# Patient Record
Sex: Male | Born: 1965
Health system: Southern US, Community
[De-identification: ages and names within clinical notes are randomized; demographics above are authoritative.]

## PROBLEM LIST (undated history)

## (undated) DIAGNOSIS — E119 Type 2 diabetes mellitus without complications: Secondary | ICD-10-CM

## (undated) HISTORY — PX: APPENDECTOMY: SHX54

---

## 2000-03-03 ENCOUNTER — Encounter: Admission: RE | Admit: 2000-03-03 | Discharge: 2000-03-03 | Payer: Self-pay | Admitting: *Deleted

## 2002-10-25 ENCOUNTER — Encounter: Payer: Self-pay | Admitting: Internal Medicine

## 2002-10-25 ENCOUNTER — Ambulatory Visit (HOSPITAL_COMMUNITY): Admission: RE | Admit: 2002-10-25 | Discharge: 2002-10-25 | Payer: Self-pay | Admitting: Internal Medicine

## 2009-05-18 DIAGNOSIS — E1121 Type 2 diabetes mellitus with diabetic nephropathy: Secondary | ICD-10-CM | POA: Insufficient documentation

## 2016-04-27 ENCOUNTER — Ambulatory Visit: Payer: 59 | Admitting: Podiatry

## 2016-10-15 DIAGNOSIS — E1129 Type 2 diabetes mellitus with other diabetic kidney complication: Secondary | ICD-10-CM | POA: Diagnosis not present

## 2016-10-15 DIAGNOSIS — R808 Other proteinuria: Secondary | ICD-10-CM | POA: Diagnosis not present

## 2016-10-15 DIAGNOSIS — R509 Fever, unspecified: Secondary | ICD-10-CM | POA: Diagnosis not present

## 2016-12-31 ENCOUNTER — Ambulatory Visit (INDEPENDENT_AMBULATORY_CARE_PROVIDER_SITE_OTHER): Payer: 59 | Admitting: Podiatry

## 2016-12-31 ENCOUNTER — Ambulatory Visit (INDEPENDENT_AMBULATORY_CARE_PROVIDER_SITE_OTHER): Payer: 59

## 2016-12-31 ENCOUNTER — Encounter: Payer: Self-pay | Admitting: Podiatry

## 2016-12-31 DIAGNOSIS — M779 Enthesopathy, unspecified: Secondary | ICD-10-CM

## 2016-12-31 DIAGNOSIS — M79672 Pain in left foot: Secondary | ICD-10-CM | POA: Diagnosis not present

## 2016-12-31 DIAGNOSIS — M2140 Flat foot [pes planus] (acquired), unspecified foot: Secondary | ICD-10-CM

## 2016-12-31 DIAGNOSIS — M76829 Posterior tibial tendinitis, unspecified leg: Secondary | ICD-10-CM

## 2016-12-31 DIAGNOSIS — M79671 Pain in right foot: Secondary | ICD-10-CM

## 2016-12-31 MED ORDER — MELOXICAM 15 MG PO TABS: 15.0000 mg | ORAL_TABLET | Freq: Every day | ORAL | 2 refills | Status: AC

## 2016-12-31 NOTE — Patient Instructions (Signed)
Posterior Tibialis Tendinosis Rehab Ask your health care provider which exercises are safe for you. Do exercises exactly as told by your health care provider and adjust them as directed. It is normal to feel mild stretching, pulling, tightness, or discomfort as you do these exercises, but you should stop right away if you feel sudden pain or your pain gets worse.Do not begin these exercises until told by your health care provider. Stretching and range of motion exercises These exercises warm up your muscles and joints and improve the movement and flexibility in your ankle and foot. These exercises may also help to relieve pain. Exercise A: Standing wall calf stretch, knee straight   1. Stand with your hands against a wall. 2. Extend your __________ leg behind you, and bend your front knee slightly. Keep both of your heels on the floor. 3. Point the toes of your back foot slightly inward. 4. Keeping your heels on the floor and your back knee straight, shift your weight toward the wall. Do not allow your back to arch. You should feel a gentle stretch in the back of your lower leg (calf). 5. Hold this position for __________ seconds. Repeat __________ times. Complete this stretch __________ times a day. Exercise B: Standing wall calf stretch, knee bent  1. Stand with your hands against a wall. 2. Extend your __________ leg behind you, and bend your front knee slightly. Keep both of your heels on the floor. 3. Point the toes of your back foot slightly inward. 4. Unlock your back knee so it is bent. Keep your heels on the floor. You should feel a gentle stretch deep in your calf. 5. Hold this position for __________ seconds. Repeat __________ times. Complete this exercise __________ times a day. Strengthening exercises These exercises build strength and endurance in your ankle and foot. Endurance is the ability to use your muscles for a long time, even after they get tired. Exercise C: Ankle  inversion with band  1. Secure one end of an exercise band or tubing to a fixed object, such as a table leg or a pole, that will stay still when the band is pulled. to an object that will not move if it is pulled on, like a table leg. 2. Loop the other end of the band around the middle of your left / right foot. 3. Sit on the floor facing the object with your __________ leg extended. The band or tube should be slightly tense when your foot is relaxed. 4. Leading with your big toe, slowly bring your __________ foot and ankle inward, toward your other foot. 5. Hold this position for __________ seconds. 6. Slowly return your foot to the starting position. Repeat __________ times. Complete this exercise __________ times a day. Exercise D: Towel curls   1. Sit in a chair on a non-carpeted surface, and put your feet on the floor. 2. Place a towel in front of your feet. If told by your health care provider, add __________ at the end of the towel. 3. Keeping your heel on the floor, put your __________ foot on the towel. 4. Pull the towel toward you by grabbing the towel with your toes and curling them under. Keep your heel on the floor. 5. Let your toes relax. 6. Grab the towel with your toes again. Keep going until the towel is completely underneath your foot. Repeat __________ times. Complete this exercise __________ times a day. Balance exercises These exercises improve or maintain your balance. Balance is  important in preventing falls. Exercise E: Single leg stand  1. Without shoes, stand near a railing or in a doorway. You can hold on to the railing or door frame as needed for balance. 2. Stand on your __________ foot. Keep your big toe down on the floor and try to keep your arch lifted. If balancing in this position is too easy, try the exercise with your eyes closed or while standing on a pillow. 3. Hold this position for __________ seconds. Repeat __________ times. Complete this exercise  __________ times a day. This information is not intended to replace advice given to you by your health care provider. Make sure you discuss any questions you have with your health care provider. Document Released: 08/24/2005 Document Revised: 04/28/2016 Document Reviewed: 05/10/2015 Elsevier Interactive Patient Education  2017 [employer].

## 2016-12-31 NOTE — Progress Notes (Signed)
Subjective:    Patient ID: Phillip Hatfield, male   DOB: 51 y.o.   MRN: [id number]   HPI 51 year old male presents the office today for concerns left foot pain which has been ongoing for about 1 month. He states he has pain when he thinks in the arch going to the ankle point the medial aspect of the foot. The pain started he did start wearing his custom inserts as well as taking ibuprofen which is been helping. The pain still persist to some degree. He denies any recent injury or trauma. Denies any change in activity level. No numbness or tingling. He is diabetic and his last A1c was 7.1.  Review of Systems  All other systems reviewed and are negative.       Objective:  Physical Exam General: AAO x3, NAD  Dermatological: Skin is warm, dry and supple bilateral. Nails x 10 are well manicured; remaining integument appears unremarkable at this time. There are no open sores, no preulcerative lesions, no rash or signs of infection present.  Vascular: Dorsalis Pedis artery and Posterior Tibial artery pedal pulses are 2/4 bilateral with immedate capillary fill time. Pedal hair growth present. No varicosities and no lower extremity edema present bilateral. There is no pain with calf compression, swelling, warmth, erythema.   Neruologic: Grossly intact via light touch bilateral. Vibratory intact via tuning fork bilateral. Protective threshold with Semmes Wienstein monofilament intact to all pedal sites bilateral. Patellar and Achilles deep tendon reflexes 2+ bilateral. No Babinski or clonus noted bilateral.   Musculoskeletal: There is tenderness mildly distal to the medial malleolus on the course of the posterior tibial tendon on the insertion into the navicular tuberosity. Tenderness appears to be intact. He is able to do a double and single heel rise although single heel rises painful for him. There is no area pinpoint bony tenderness or pain the vibratory sensation. Decrease in medial arch upon  weightbearing. Muscular strength 5/5 in all groups tested bilateral.  Gait: Unassisted, Nonantalgic.      Assessment:     51 year old male left posterior tibial tendinitis, dysfunction    Plan:      -Treatment options discussed including all alternatives, risks, and complications -Etiology of symptoms were discussed -X-rays were obtained and reviewed with the patient. Talonavicular joint arthritis. Evidence of flatfoot. No evidence for acute fracture. -Prescribed mobic. Discussed side effects of the medication and directed to stop if any are to occur and call the office.  -Orthotics are fitting well. As his pain is improving will hold off on ankle brace -Rehab exercises for PT tendon -RTC in 4 weeks if symptoms continue  Celesta Gentile, DPM

## 2017-04-03 ENCOUNTER — Other Ambulatory Visit: Payer: Self-pay | Admitting: Podiatry

## 2017-04-15 DIAGNOSIS — Z0000 Encounter for general adult medical examination without abnormal findings: Secondary | ICD-10-CM | POA: Diagnosis not present

## 2017-04-22 DIAGNOSIS — E1129 Type 2 diabetes mellitus with other diabetic kidney complication: Secondary | ICD-10-CM | POA: Diagnosis not present

## 2017-04-22 DIAGNOSIS — Z0000 Encounter for general adult medical examination without abnormal findings: Secondary | ICD-10-CM | POA: Diagnosis not present

## 2017-04-22 DIAGNOSIS — Z794 Long term (current) use of insulin: Secondary | ICD-10-CM | POA: Diagnosis not present

## 2017-04-22 DIAGNOSIS — Z1389 Encounter for screening for other disorder: Secondary | ICD-10-CM | POA: Diagnosis not present

## 2017-04-22 DIAGNOSIS — E1165 Type 2 diabetes mellitus with hyperglycemia: Secondary | ICD-10-CM | POA: Diagnosis not present

## 2017-04-23 DIAGNOSIS — Z1212 Encounter for screening for malignant neoplasm of rectum: Secondary | ICD-10-CM | POA: Diagnosis not present

## 2017-05-26 DIAGNOSIS — E1165 Type 2 diabetes mellitus with hyperglycemia: Secondary | ICD-10-CM | POA: Diagnosis not present

## 2017-05-26 DIAGNOSIS — Z794 Long term (current) use of insulin: Secondary | ICD-10-CM | POA: Diagnosis not present

## 2017-05-26 DIAGNOSIS — I10 Essential (primary) hypertension: Secondary | ICD-10-CM | POA: Diagnosis not present

## 2017-06-21 DIAGNOSIS — Z23 Encounter for immunization: Secondary | ICD-10-CM | POA: Diagnosis not present

## 2017-07-22 DIAGNOSIS — E1129 Type 2 diabetes mellitus with other diabetic kidney complication: Secondary | ICD-10-CM | POA: Diagnosis not present

## 2017-07-22 DIAGNOSIS — Z794 Long term (current) use of insulin: Secondary | ICD-10-CM | POA: Diagnosis not present

## 2017-07-22 DIAGNOSIS — E1165 Type 2 diabetes mellitus with hyperglycemia: Secondary | ICD-10-CM | POA: Diagnosis not present

## 2017-09-27 ENCOUNTER — Encounter: Payer: Self-pay | Admitting: Podiatry

## 2017-09-27 ENCOUNTER — Ambulatory Visit: Payer: 59 | Admitting: Podiatry

## 2017-09-27 DIAGNOSIS — S90222A Contusion of left lesser toe(s) with damage to nail, initial encounter: Secondary | ICD-10-CM | POA: Diagnosis not present

## 2017-09-27 DIAGNOSIS — L601 Onycholysis: Secondary | ICD-10-CM | POA: Diagnosis not present

## 2017-09-27 DIAGNOSIS — D225 Melanocytic nevi of trunk: Secondary | ICD-10-CM | POA: Diagnosis not present

## 2017-09-27 DIAGNOSIS — X32XXXD Exposure to sunlight, subsequent encounter: Secondary | ICD-10-CM | POA: Diagnosis not present

## 2017-09-27 DIAGNOSIS — Z1283 Encounter for screening for malignant neoplasm of skin: Secondary | ICD-10-CM | POA: Diagnosis not present

## 2017-09-27 DIAGNOSIS — L570 Actinic keratosis: Secondary | ICD-10-CM | POA: Diagnosis not present

## 2017-09-27 MED ORDER — AMOXICILLIN-POT CLAVULANATE 875-125 MG PO TABS: 1.0000 | ORAL_TABLET | Freq: Two times a day (BID) | ORAL | 0 refills | Status: DC

## 2017-09-27 NOTE — Patient Instructions (Signed)

## 2017-09-29 NOTE — Progress Notes (Signed)
Subjective: Phillip Hatfield presents the office today for concerns of a balloon toenail which is been ongoing for the last several days that he has noticed but he is not sure when it started.  Denies any significant pain to the area denies any redness or drainage or any swelling.  He has noticed the nail somewhat loose.  He saw his dermatologist today for another issue and recommended to drill a hole in the area to drain it.  He presents today for evaluation of this.  Unsure of an injury.  Denies any systemic complaints such as fevers, chills, nausea, vomiting. No acute changes since last appointment, and no other complaints at this time.   Objective: AAO x3, NAD DP/PT pulses palpable bilaterally, CRT less than 3 seconds Left hallux toenail is loose from the underlying nail bed.  I was able to debride some of the loose toenail distally and upon debridement clear fluid was expressed from underneath the toenail the nail was lifted from the nail bed and you are able to visualize a granular wound base.  Because of this I recommended total nail avulsion, please see procedure note below.  No significant surrounding erythema there is no ascending cellulitis.  There is no clinical signs of infection noted. No open lesions or pre-ulcerative lesions.  No pain with calf compression, swelling, warmth, erythema  Assessment: Onycholysis, subungual hematoma  Plan: -All treatment options discussed with the patient including all alternatives, risks, complications.  -I started to debride the nail and realized that the nail was loose and there was a wound underneath the toenail present.  At this time, recommended total nail removal without chemical matricectomy to the left hallux toenail. Risks and complications were discussed with the patient for which they understand and written consent was obtained for the procedure. Under sterile conditions a total of 3 mL of a mixture of 2% lidocaine plain and 0.5% Marcaine plain was  infiltrated in a hallux block fashion. Once anesthetized, the skin was prepped in sterile fashion. A tourniquet was then applied. Next the left hallux nail was excised making sure to remove the entire offending nail border.  Clear drainage is expressed.  The underlying nail bed appeared to be intact there was old dried blood present which was debrided.  There was a granular wound base present.  No pus.  Once the nail was removed, the area was debrided and the underlying skin was intact. The area was irrigated and hemostasis was obtained.  A dry sterile dressing was applied. After application of the dressing the tourniquet was removed and there is found to be an immediate capillary refill time to the digit. The patient tolerated the procedure well any complications. Post procedure instructions were discussed the patient for which he verbally understood. Follow-up in one week for nail check or sooner if any problems are to arise. Discussed signs/symptoms of worsening infection and directed to call the office immediately should any occur or go directly to the emergency room. In the meantime, encouraged to call the office with any questions, concerns, changes symptoms. -Augmentin -Patient encouraged to call the office with any questions, concerns, change in symptoms.   Trula Slade DPM

## 2017-10-05 ENCOUNTER — Ambulatory Visit (INDEPENDENT_AMBULATORY_CARE_PROVIDER_SITE_OTHER): Payer: 59 | Admitting: Podiatry

## 2017-10-05 DIAGNOSIS — S90222A Contusion of left lesser toe(s) with damage to nail, initial encounter: Secondary | ICD-10-CM

## 2017-10-05 DIAGNOSIS — L601 Onycholysis: Secondary | ICD-10-CM

## 2017-10-05 NOTE — Patient Instructions (Signed)

## 2017-10-05 NOTE — Progress Notes (Signed)
Subjective: Phillip Hatfield is a 52 y.o.  male returns to office today for follow up evaluation after having left hallux total nail avulsion performed due to onycholysis/wound. Patient has been soaking using epsom salts and applying topical antibiotic covered with bandaid daily. Patient denies fevers, chills, nausea, vomiting. Denies any calf pain, chest pain, SOB.   Objective:  Vitals: Reviewed  General: Well developed, nourished, in no acute distress, alert and oriented x3   Dermatology: Skin is warm, dry and supple bilateral. Left hallux nail bed appears to be clean, dry, with mild granular tissue and surrounding scab and is almost healed. There is no surrounding erythema, edema, drainage/purulence. The remaining nails appear unremarkable at this time. There are no other lesions or other signs of infection present.  Neurovascular status: Intact. No lower extremity swelling; No pain with calf compression bilateral.  Musculoskeletal: Decreased tenderness to palpation of the left hallux nail bed. Muscular strength within normal limits bilateral.   Assesement and Plan: S/p partial nail avulsion, doing well.   -Continue soaking in epsom salts twice a day followed by antibiotic ointment and a band-aid. Can leave uncovered at night. Continue this until completely healed.  -If the area has not healed in 2 weeks, call the office for follow-up appointment, or sooner if any problems arise.  -Monitor for any signs/symptoms of infection. Call the office immediately if any occur or go directly to the emergency room. Call with any questions/concerns.  Celesta Gentile, DPM

## 2017-10-18 DIAGNOSIS — E1129 Type 2 diabetes mellitus with other diabetic kidney complication: Secondary | ICD-10-CM | POA: Diagnosis not present

## 2017-10-18 DIAGNOSIS — Z794 Long term (current) use of insulin: Secondary | ICD-10-CM | POA: Diagnosis not present

## 2017-10-18 DIAGNOSIS — E1165 Type 2 diabetes mellitus with hyperglycemia: Secondary | ICD-10-CM | POA: Diagnosis not present

## 2017-12-06 ENCOUNTER — Ambulatory Visit: Payer: 59 | Admitting: Podiatry

## 2017-12-06 ENCOUNTER — Encounter: Payer: Self-pay | Admitting: Podiatry

## 2017-12-06 DIAGNOSIS — L601 Onycholysis: Secondary | ICD-10-CM | POA: Diagnosis not present

## 2017-12-06 DIAGNOSIS — S90222A Contusion of left lesser toe(s) with damage to nail, initial encounter: Secondary | ICD-10-CM

## 2017-12-06 NOTE — Progress Notes (Signed)
Subjective: Phillip Hatfield presents the office today for concerns of his left second toenail becoming loose and painful he is getting pain towards the base of the nail.  He states his wife has noticed there is been wrong with the toenail and had him come to the office.  He denies any drainage or pus coming from the area denies any redness or swelling that he has noticed.  He said no recent treatment for this.  No recent injury that he can recall.  He thinks it may have started after his toenail got too long.  Denies any systemic complaints such as fevers, chills, nausea, vomiting. No acute changes since last appointment, and no other complaints at this time.   Objective: AAO x3, NAD DP/PT pulses palpable bilaterally, CRT less than 3 seconds The left second digit toenail was loose from the underlying nail bed and only attached on the proximal aspect and there is incurvation of both the medial lateral aspects of the nail corner there appears to be some mild localized edema to the nail corners but there is no drainage or pus or any ascending cellulitis.  There does appear to be old subungual hematoma to the entire toenail.  There is tenderness to the proximal nail border.   No open lesions or pre-ulcerative lesions.  No pain with calf compression, swelling, warmth, erythema  Assessment: Left second digit onycholysis  Plan: -All treatment options discussed with the patient including all alternatives, risks, complications.  -We discussed both conservative as well as surgical options.  This point I recommended a total nail avulsion left second digit toenail temporarily due to the pain as well as the lifting of the nail.  He was to go ahead and proceed with this today.  Risks and complications were discussed with the patient for which they understand and written consent was obtained. Under sterile conditions a total of 3 mL of a mixture of 2% lidocaine plain and 0.5% Marcaine plain was infiltrated in a digital  block fashion. Once anesthetized, the skin was prepped in sterile fashion. A tourniquet was then applied. Next the the left second digit nail was excised making sure to remove the entire offending nail border. Once the nail was  Removed, the area was debrided and the underlying skin was intact. The area was irrigated and hemostasis was obtained.  A dry sterile dressing was applied. After application of the dressing the tourniquet was removed and there is found to be an immediate capillary refill time to the digit. The patient tolerated the procedure well any complications. Post procedure instructions were discussed the patient for which he verbally understood. Follow-up in one week for nail check or sooner if any problems are to arise. Discussed signs/symptoms of worsening infection and directed to call the office immediately should any occur or go directly to the emergency room. In the meantime, encouraged to call the office with any questions, concerns, changes symptoms.  Trula Slade DPM    -Patient encouraged to call the office with any questions, concerns, change in symptoms.

## 2017-12-06 NOTE — Patient Instructions (Signed)

## 2017-12-13 ENCOUNTER — Ambulatory Visit: Payer: 59 | Admitting: Podiatry

## 2017-12-13 DIAGNOSIS — Z9889 Other specified postprocedural states: Secondary | ICD-10-CM

## 2017-12-13 DIAGNOSIS — L601 Onycholysis: Secondary | ICD-10-CM

## 2017-12-13 NOTE — Patient Instructions (Signed)

## 2017-12-13 NOTE — Progress Notes (Signed)
Subjective: Phillip Hatfield is a 52 y.o.  male returns to office today for follow up evaluation after having left 2nd digit total nail avulsion performed. Patient has been soaking using epsom salts and applying topical antibiotic covered with bandaid daily but he did not today. He only noticed some bloody drainage for the first few days, no pus. No redness. Patient denies fevers, chills, nausea, vomiting. Denies any calf pain, chest pain, SOB.   Objective:  Vitals: Reviewed  General: Well developed, nourished, in no acute distress, alert and oriented x3   Dermatology: Skin is warm, dry and supple bilateral.Left 2nd digit nail border appears to be clean, dry and is healed.  There is no surrounding erythema, edema, drainage/purulence. The remaining nails appear unremarkable at this time. There are no other lesions or other signs of infection present.  Neurovascular status: Intact. No lower extremity swelling; No pain with calf compression bilateral.  Musculoskeletal: No tenderness to palpation of the left 2nd digit nail bed. Muscular strength within normal limits bilateral.   Assesement and Plan: S/p partial nail avulsion, doing well.   -Continue soaking in epsom salts twice a day followed by antibiotic ointment and a band-aid. Can leave uncovered at night. Continue this until completely healed.  -If the area has not healed in 2 weeks, call the office for follow-up appointment, or sooner if any problems arise.  -Monitor for any signs/symptoms of infection. Call the office immediately if any occur or go directly to the emergency room. Call with any questions/concerns.  Celesta Gentile, DPM

## 2018-01-17 DIAGNOSIS — Z794 Long term (current) use of insulin: Secondary | ICD-10-CM | POA: Diagnosis not present

## 2018-01-17 DIAGNOSIS — E1165 Type 2 diabetes mellitus with hyperglycemia: Secondary | ICD-10-CM | POA: Diagnosis not present

## 2018-01-17 DIAGNOSIS — E1129 Type 2 diabetes mellitus with other diabetic kidney complication: Secondary | ICD-10-CM | POA: Diagnosis not present

## 2018-04-18 DIAGNOSIS — Z0000 Encounter for general adult medical examination without abnormal findings: Secondary | ICD-10-CM | POA: Diagnosis not present

## 2018-04-18 DIAGNOSIS — R82998 Other abnormal findings in urine: Secondary | ICD-10-CM | POA: Diagnosis not present

## 2018-04-25 DIAGNOSIS — E1165 Type 2 diabetes mellitus with hyperglycemia: Secondary | ICD-10-CM | POA: Diagnosis not present

## 2018-04-25 DIAGNOSIS — Z794 Long term (current) use of insulin: Secondary | ICD-10-CM | POA: Diagnosis not present

## 2018-04-25 DIAGNOSIS — Z1389 Encounter for screening for other disorder: Secondary | ICD-10-CM | POA: Diagnosis not present

## 2018-04-25 DIAGNOSIS — Z0000 Encounter for general adult medical examination without abnormal findings: Secondary | ICD-10-CM | POA: Diagnosis not present

## 2018-04-25 DIAGNOSIS — Z125 Encounter for screening for malignant neoplasm of prostate: Secondary | ICD-10-CM | POA: Diagnosis not present

## 2018-04-25 DIAGNOSIS — E1129 Type 2 diabetes mellitus with other diabetic kidney complication: Secondary | ICD-10-CM | POA: Diagnosis not present

## 2018-04-29 DIAGNOSIS — Z1212 Encounter for screening for malignant neoplasm of rectum: Secondary | ICD-10-CM | POA: Diagnosis not present

## 2018-06-06 DIAGNOSIS — R195 Other fecal abnormalities: Secondary | ICD-10-CM | POA: Diagnosis not present

## 2018-08-08 DIAGNOSIS — Z794 Long term (current) use of insulin: Secondary | ICD-10-CM | POA: Diagnosis not present

## 2018-08-08 DIAGNOSIS — E1165 Type 2 diabetes mellitus with hyperglycemia: Secondary | ICD-10-CM | POA: Diagnosis not present

## 2018-08-08 DIAGNOSIS — E1129 Type 2 diabetes mellitus with other diabetic kidney complication: Secondary | ICD-10-CM | POA: Diagnosis not present

## 2018-09-12 DIAGNOSIS — R195 Other fecal abnormalities: Secondary | ICD-10-CM | POA: Diagnosis not present

## 2018-11-07 DIAGNOSIS — E1129 Type 2 diabetes mellitus with other diabetic kidney complication: Secondary | ICD-10-CM | POA: Diagnosis not present

## 2018-11-07 DIAGNOSIS — Z794 Long term (current) use of insulin: Secondary | ICD-10-CM | POA: Diagnosis not present

## 2018-11-07 DIAGNOSIS — E1165 Type 2 diabetes mellitus with hyperglycemia: Secondary | ICD-10-CM | POA: Diagnosis not present

## 2019-11-10 ENCOUNTER — Ambulatory Visit: Payer: Self-pay | Attending: Internal Medicine

## 2019-11-10 DIAGNOSIS — Z23 Encounter for immunization: Secondary | ICD-10-CM | POA: Insufficient documentation

## 2019-11-10 NOTE — Progress Notes (Signed)
   Covid-19 Vaccination Clinic  Name:  Phillip Hatfield    MRN: [id number] DOB: 10-25-1965  11/10/2019  Phillip Hatfield was observed post Covid-19 immunization for 15 minutes without incident. He was provided with Vaccine Information Sheet and instruction to access the V-Safe system.   Phillip Hatfield was instructed to call 911 with any severe reactions post vaccine: Marland Kitchen Difficulty breathing  . Swelling of face and throat  . A fast heartbeat  . A bad rash all over body  . Dizziness and weakness

## 2019-12-11 ENCOUNTER — Ambulatory Visit: Payer: Self-pay

## 2019-12-13 ENCOUNTER — Ambulatory Visit: Payer: Self-pay | Attending: Internal Medicine

## 2019-12-13 DIAGNOSIS — Z23 Encounter for immunization: Secondary | ICD-10-CM

## 2019-12-13 NOTE — Progress Notes (Signed)
   Covid-19 Vaccination Clinic  Name:  Fidencio Rogus    MRN: [id number] DOB: 1966-08-20  12/13/2019  Mr. Kalafut was observed post Covid-19 immunization for 15 minutes without incident. He was provided with Vaccine Information Sheet and instruction to access the V-Safe system.   Mr. Learn was instructed to call 911 with any severe reactions post vaccine: Marland Kitchen Difficulty breathing  . Swelling of face and throat  . A fast heartbeat  . A bad rash all over body  . Dizziness and weakness   Immunizations Administered    Name Date Dose VIS Date Route   Pfizer COVID-19 Vaccine 12/13/2019  8:11 AM 0.3 mL 08/18/2019 Intramuscular   Manufacturer: [employer]   Lot: [id number]   [place]: [id number]

## 2021-08-11 ENCOUNTER — Other Ambulatory Visit: Payer: Self-pay

## 2021-08-11 ENCOUNTER — Ambulatory Visit: Payer: 59 | Admitting: Podiatry

## 2021-08-11 DIAGNOSIS — L600 Ingrowing nail: Secondary | ICD-10-CM | POA: Diagnosis not present

## 2021-08-11 DIAGNOSIS — L603 Nail dystrophy: Secondary | ICD-10-CM | POA: Diagnosis not present

## 2021-08-11 NOTE — Patient Instructions (Signed)

## 2021-08-12 NOTE — Progress Notes (Signed)
Subjective:   Patient ID: Phillip Hatfield, male   DOB: 55 y.o.   MRN: [id number]   HPI 55 year old male presents the office today for concerns of ingrown toenail, pain to his left big toe, lateral aspect which has been ongoing for 1 month.  There is some redness around the corner.  He has some drainage at first but this has subsided.  Currently in has improved but still having tenderness.  He has tried soaking Epson salt as well as vinegar.  His wife is also present and feels that with his work shoes he is hitting his toe against into the shoe.  He is also had this toenail removed previously due to injury back in 2019.  He is diabetic and states his last A1c was 7.5.  Last blood sugar was 125.  Review of Systems  All other systems reviewed and are negative.  No past medical history on file.  No past surgical history on file.   Current Outpatient Medications:    ezetimibe-simvastatin (VYTORIN) 10-40 MG tablet, Take 1 tablet by mouth daily., Disp: , Rfl: 6   hydrochlorothiazide (HYDRODIURIL) 25 MG tablet, Take 25 mg by mouth 2 (two) times daily., Disp: , Rfl:    JARDIANCE 25 MG TABS tablet, Take 25 mg by mouth daily., Disp: , Rfl:    KOMBIGLYZE XR 2.01-999 MG TB24, Take 1 tablet by mouth 2 (two) times daily., Disp: , Rfl: 6   LANTUS SOLOSTAR 100 UNIT/ML Solostar Pen, Inject into the skin., Disp: , Rfl:    lisinopril (PRINIVIL,ZESTRIL) 20 MG tablet, Take 20 mg by mouth 2 (two) times daily., Disp: , Rfl: 8   montelukast (SINGULAIR) 10 MG tablet, Take 10 mg by mouth daily., Disp: , Rfl:   Not on File        Objective:  Physical Exam  General: AAO x3, NAD  Dermatological: On the left hallux toenail is incurvation present to the lateral aspect of the toenail.  Localized edema and erythema more from inflammation as opposed to infection.  No ascending cellulitis.  No drainage or pus.  The nail is loose with underlying nailbed more along the lateral nail border.  The nail is dystrophic  with mild yellow discoloration.  No open lesions otherwise.  Vascular: Dorsalis Pedis artery and Posterior Tibial artery pedal pulses are 2/4 bilateral with immedate capillary fill time.  There is no pain with calf compression, swelling, warmth, erythema.   Neruologic: Grossly intact via light touch bilateral.   Musculoskeletal: No gross boney pedal deformities bilateral. No pain, crepitus, or limitation noted with foot and ankle range of motion bilateral. Muscular strength 5/5 in all groups tested bilateral.  Gait: Unassisted, Nonantalgic.       Assessment:   Ingrown toenail left lateral; onychodystrophy     Plan:  -Treatment options discussed including all alternatives, risks, and complications -Etiology of symptoms were discussed -At this time, the patient is requesting partial nail removal with chemical matricectomy to the symptomatic portion of the nail. Risks and complications were discussed with the patient for which they understand and written consent was obtained. Under sterile conditions a total of 3 mL of a mixture of 2% lidocaine plain and 0.5% Marcaine plain was infiltrated in a hallux block fashion. Once anesthetized, the skin was prepped in sterile fashion. A tourniquet was then applied. Next the lateral aspect of hallux nail border was then sharply excised making sure to remove the entire offending nail border. Once the nails were ensured to be removed area  was debrided and the underlying skin was intact. There is no purulence identified in the procedure. Next phenol was then applied under standard conditions and copiously irrigated. Silvadene was applied. A dry sterile dressing was applied. After application of the dressing the tourniquet was removed and there is found to be an immediate capillary refill time to the digit. The patient tolerated the procedure well any complications. Post procedure instructions were discussed the patient for which he verbally understood. Follow-up  in one week for nail check or sooner if any problems are to arise. Discussed signs/symptoms of infection and directed to call the office immediately should any occur or go directly to the emergency room. In the meantime, encouraged to call the office with any questions, concerns, changes symptoms. -Offloading for the hallux to avoid excess pressure/injury.   Trula Slade DPM

## 2021-08-25 ENCOUNTER — Ambulatory Visit: Payer: 59 | Admitting: Podiatry

## 2021-08-25 ENCOUNTER — Other Ambulatory Visit: Payer: Self-pay

## 2021-08-25 DIAGNOSIS — L600 Ingrowing nail: Secondary | ICD-10-CM

## 2021-08-28 DIAGNOSIS — L600 Ingrowing nail: Secondary | ICD-10-CM | POA: Insufficient documentation

## 2021-08-28 NOTE — Progress Notes (Signed)
Subjective: 55 year old male presents the office today for follow evaluation after undergoing partial nail avulsion to his left big toe.  States has been healing well.  He has no concerns.  No swelling redness or any drainage.  Objective: AAO x3, NAD DP/PT pulses palpable bilaterally, CRT less than 3 seconds Status post partial nail avulsion.  Slight scabbing is present.  There is no drainage or pus.  No edema or erythema but otherwise appears to be healing well.  No significant granulation tissue is noted today. No pain with calf compression, swelling, warmth, erythema  Assessment: Status post partial nail avulsion, healing well  Plan: -All treatment options discussed with the patient including all alternatives, risks, complications.  -Continue washing with soap and water daily.  Small amount of antibiotic ointment is applied and a bandage.  I would continue to start the day but leave the area open at nighttime for the next week. -Patient encouraged to call the office with any questions, concerns, change in symptoms.   Trula Slade DPM

## 2021-12-08 DIAGNOSIS — M65332 Trigger finger, left middle finger: Secondary | ICD-10-CM | POA: Insufficient documentation

## 2021-12-24 ENCOUNTER — Ambulatory Visit (HOSPITAL_BASED_OUTPATIENT_CLINIC_OR_DEPARTMENT_OTHER)
Admission: RE | Admit: 2021-12-24 | Discharge: 2021-12-24 | Disposition: A | Discharge status: Home / Self Care | Payer: 59 | Source: Ambulatory Visit | Attending: Registered Nurse | Admitting: Registered Nurse

## 2021-12-24 ENCOUNTER — Other Ambulatory Visit: Payer: Self-pay | Admitting: Registered Nurse

## 2021-12-24 ENCOUNTER — Other Ambulatory Visit (HOSPITAL_BASED_OUTPATIENT_CLINIC_OR_DEPARTMENT_OTHER): Payer: Self-pay | Admitting: Registered Nurse

## 2021-12-24 DIAGNOSIS — J3489 Other specified disorders of nose and nasal sinuses: Secondary | ICD-10-CM

## 2021-12-24 DIAGNOSIS — J0110 Acute frontal sinusitis, unspecified: Secondary | ICD-10-CM | POA: Diagnosis present

## 2021-12-27 ENCOUNTER — Emergency Department (HOSPITAL_BASED_OUTPATIENT_CLINIC_OR_DEPARTMENT_OTHER)
Admission: EM | Admit: 2021-12-27 | Discharge: 2021-12-27 | Disposition: A | Discharge status: Home / Self Care | Payer: 59 | Attending: Emergency Medicine | Admitting: Emergency Medicine

## 2021-12-27 ENCOUNTER — Other Ambulatory Visit: Payer: Self-pay

## 2021-12-27 ENCOUNTER — Encounter (HOSPITAL_BASED_OUTPATIENT_CLINIC_OR_DEPARTMENT_OTHER): Payer: Self-pay | Admitting: *Deleted

## 2021-12-27 DIAGNOSIS — Z79899 Other long term (current) drug therapy: Secondary | ICD-10-CM | POA: Insufficient documentation

## 2021-12-27 DIAGNOSIS — R519 Headache, unspecified: Secondary | ICD-10-CM | POA: Diagnosis present

## 2021-12-27 DIAGNOSIS — Z794 Long term (current) use of insulin: Secondary | ICD-10-CM | POA: Insufficient documentation

## 2021-12-27 DIAGNOSIS — H532 Diplopia: Secondary | ICD-10-CM

## 2021-12-27 HISTORY — DX: Type 2 diabetes mellitus without complications: E11.9

## 2021-12-27 LAB — CBC WITH DIFFERENTIAL/PLATELET
Abs Immature Granulocytes: 0.04 10*3/uL (ref 0.00–0.07)
Basophils Absolute: 0.1 10*3/uL (ref 0.0–0.1)
Basophils Relative: 1 %
Eosinophils Absolute: 0.1 10*3/uL (ref 0.0–0.5)
Eosinophils Relative: 1 %
HCT: 50.9 % (ref 39.0–52.0)
Hemoglobin: 16.8 g/dL (ref 13.0–17.0)
Immature Granulocytes: 0 %
Lymphocytes Relative: 32 %
Lymphs Abs: 3.2 10*3/uL (ref 0.7–4.0)
MCH: 27.2 pg (ref 26.0–34.0)
MCHC: 33 g/dL (ref 30.0–36.0)
MCV: 82.5 fL (ref 80.0–100.0)
Monocytes Absolute: 1.2 10*3/uL — ABNORMAL HIGH (ref 0.1–1.0)
Monocytes Relative: 12 %
Neutro Abs: 5.5 10*3/uL (ref 1.7–7.7)
Neutrophils Relative %: 54 %
Platelets: 331 10*3/uL (ref 150–400)
RBC: 6.17 MIL/uL — ABNORMAL HIGH (ref 4.22–5.81)
RDW: 14.9 % (ref 11.5–15.5)
WBC: 10.2 10*3/uL (ref 4.0–10.5)
nRBC: 0 % (ref 0.0–0.2)

## 2021-12-27 LAB — BASIC METABOLIC PANEL
Anion gap: 14 (ref 5–15)
BUN: 20 mg/dL (ref 6–20)
CO2: 29 mmol/L (ref 22–32)
Calcium: 9.8 mg/dL (ref 8.9–10.3)
Chloride: 95 mmol/L — ABNORMAL LOW (ref 98–111)
Creatinine, Ser: 0.93 mg/dL (ref 0.61–1.24)
GFR, Estimated: >60 mL/min (ref >60)
Glucose, Bld: 72 mg/dL (ref 70–99)
Potassium: 3.1 mmol/L — ABNORMAL LOW (ref 3.5–5.1)
Sodium: 138 mmol/L (ref 135–145)

## 2021-12-27 MED ORDER — TETRACAINE HCL 0.5 % OP SOLN
1.0000 [drp] | Freq: Once | OPHTHALMIC | Status: AC
  Administered 2021-12-27: 1 [drp] via OPHTHALMIC
  Filled 2021-12-27: qty 4

## 2021-12-27 NOTE — ED Provider Notes (Signed)
?[facility] ?Provider Note ? ? ?CSN: [id number] ?Arrival date & time: 12/27/21  2048 ? ?  ? ?History ? ?No chief complaint on file. ? ? ?Phillip Hatfield is a 56 y.o. male. ? ?HPI ?Patient reports symptoms started 8 days ago.  He started experiencing a pressure achy headache that was focal behind his left eye and at the temple.  He reports it was very uncomfortable but he did not have associated symptoms.  He thought it was sinus pressure.  He saw his PCP and was started on antibiotics.  Patient denies he had any nasal drainage or bleeding.  He did not have fevers or chills.  He reports after about 3 days of antibiotics, he was not getting any improvement.  At that time his doctor added a CT scan of the sinuses.  Patient reports that he was told that the CT did show sinus infection and he was continued on his antibiotics.  He reports that several days ago he noted that his eyelids seem puffy and when he looks down he gets double vision.  He reports that each eye individually has normal vision and this only occurs when he is gazing downward.  No other symptoms of weakness numbness or tingling.  Patient reports he does not feel like he has had a stroke because he does not have any other symptoms.  No fevers no chills no neck stiffness.  No nausea no vomiting.  Patient denies history of similar symptoms.  He does feel that there has been slight improvement over the past couple days and wonders if now he is getting better.  He reports he still has some headache but is not as bad as it was. ?  ? ?Home Medications ?Prior to Admission medications   ?Medication Sig Start Date End Date Taking? Authorizing Provider  ?ezetimibe-simvastatin (VYTORIN) 10-40 MG tablet Take 1 tablet by mouth daily. 12/21/16   [provider]  ?hydrochlorothiazide (HYDRODIURIL) 25 MG tablet Take 25 mg by mouth 2 (two) times daily. 05/14/21   [provider]  ?JARDIANCE 25 MG TABS tablet Take 25 mg by mouth  daily. 07/27/21   [provider]  ?KOMBIGLYZE XR 2.01-999 MG TB24 Take 1 tablet by mouth 2 (two) times daily. 12/21/16   [provider]  ?LANTUS SOLOSTAR 100 UNIT/ML Solostar Pen Inject into the skin. 07/30/21   [provider]  ?lisinopril (PRINIVIL,ZESTRIL) 20 MG tablet Take 20 mg by mouth 2 (two) times daily. 12/21/16   [provider]  ?montelukast (SINGULAIR) 10 MG tablet Take 10 mg by mouth daily. 07/27/21   [provider]  ?   ? ?Allergies    ?Patient has no allergy information on record.   ? ?Review of Systems   ?Review of Systems ?10 systems reviewed and negative except as per HPI. ?Physical Exam ?Updated Vital Signs ?BP (!) 151/71   Pulse 91   Temp 97.8 ?F (36.6 ?C) (Oral)   Resp 20   Ht '5\' 9"'$  (1.753 m)   Wt 103.4 kg   SpO2 97%   BMI 33.67 kg/m?  ?Physical Exam ?Constitutional:   ?   Comments: Alert nontoxic clinically well in appearance.  ?HENT:  ?   Head:  ?   Comments: No significant facial swelling. ?   Right Ear: Tympanic membrane normal.  ?   Left Ear: Tympanic membrane normal.  ?   Mouth/Throat:  ?   Mouth: Mucous membranes are moist.  ?   Pharynx: Oropharynx is  clear.  ?   Comments: Dentition in good condition ?Eyes:  ?   Comments: Intraocular pressure with Tono-Pen: ?Right eye 21 ?Left eye 19 ? ?Pupils are symmetric and responsive.  Extraocular motions appear to be conjugate.  Questionable slight delay of the left to lateral gaze and inferior gaze.  ?Cardiovascular:  ?   Rate and Rhythm: Normal rate.  ?Pulmonary:  ?   Effort: Pulmonary effort is normal.  ?   Breath sounds: Normal breath sounds.  ?Abdominal:  ?   General: There is no distension.  ?   Palpations: Abdomen is soft.  ?   Tenderness: There is no abdominal tenderness. There is no guarding.  ?Musculoskeletal:     ?   General: Normal range of motion.  ?   Cervical back: Neck supple.  ?Skin: ?   General: Skin is warm and dry.  ?Neurological:  ?   General: No focal deficit present.  ?    Mental Status: He is oriented to person, place, and time.  ?   Motor: No weakness.  ?   Coordination: Coordination normal.  ?   Comments: Cognitive function normal.  Speech normal.  Aside from lid droop and possible slight gaze palsy and downward lateral position on the left, cranial nerves otherwise intact.  Motor strength intact.   ?Psychiatric:     ?   Mood and Affect: Mood normal.  ? ? ? ?ED Results / Procedures / Treatments   ?Labs ?(all labs ordered are listed, but only abnormal results are displayed) ?Labs Reviewed  ?BASIC METABOLIC PANEL - Abnormal; Notable for the following components:  ?    Result Value  ? Potassium 3.1 (*)   ? Chloride 95 (*)   ? All other components within normal limits  ?CBC WITH DIFFERENTIAL/PLATELET - Abnormal; Notable for the following components:  ? RBC 6.17 (*)   ? Monocytes Absolute 1.2 (*)   ? All other components within normal limits  ?SEDIMENTATION RATE  ? ? ?EKG ?EKG Interpretation ? ?Date/Time:  Saturday December 27 2021 21:33:58 EDT ?Ventricular Rate:  87 ?PR Interval:  174 ?QRS Duration: 96 ?QT Interval:  330 ?QTC Calculation: 397 ?R Axis:   -25 ?Text Interpretation: Sinus rhythm Borderline left axis deviation Low voltage, precordial leads Consider anterior infarct Minimal ST depression, lateral leads no old comparison Confirmed by Charlesetta Shanks [id number]) on 12/27/2021 11:38:22 PM ? ?Radiology ?No results found. ? ?Procedures ?Procedures  ? ? ?Medications Ordered in ED ?Medications  ?tetracaine (PONTOCAINE) 0.5 % ophthalmic solution 1 drop (1 drop Both Eyes Given by Other 12/27/21 2350)  ? ? ?ED Course/ Medical Decision Making/ A&P ?  ?                        ?Medical Decision Making ?Amount and/or Complexity of Data Reviewed ?Labs: ordered. ? ?Risk ?Prescription drug management. ? ? ?Patient presents with a lid droop on the left and a double vision triggered by downward gaze.  Neurologic exam otherwise normal.  Symptoms have been present for approximately 8 days with a focal  left-sided headache behind the eye and at the temple.  Facial exam does not show significant swelling.  Patient does not have pain over the zygoma.  No proptosis or soft tissue swelling to suggest preseptal cellulitis or orbital cellulitis. ? ?CT results reviewed from prior study 4\19.  No significant findings. ? ?Consult: Case reviewed with Dr. Leonel Ramsay.  After discussion, Dr. Leonel Ramsay suggests MRI brain and orbits  with and without contrast.  Suspicion is for microvascular 4th nerve palsy. ? ?Tono-Pen used for intraocular pressures which are normal. ? ?I discussed recommendation for MRI with the patient.  Advised that would require transfer to [facility].  Patient reports that he does not feel that he can go through an MRI tonight.  He reports its too late and he has some claustrophobia and does not feel that he could tolerate it this evening. ? ?Patient symptoms have been stable and onset was 8 days ago without worsening.  At this time patient is counseled to return immediately if there is any evolution of symptoms or new symptoms.  Patient is highly recommended to return to the emergency department to complete diagnostic work-up with MRI.  Recommendations if patient does not return is to seek care right away at the beginning of the week to complete work-up on outpatient basis. ? ?Patient is discharged in good condition.  His mental status is clear.  Blood pressures are controlled.  Careful return precautions reviewed. ? ? ? ? ? ? ? ?Final Clinical Impression(s) / ED Diagnoses ?Final diagnoses:  ?Unilateral headache  ?Double vision  ? ? ?Rx / DC Orders ?ED Discharge Orders   ? ? None  ? ?  ? ? ?  ?Charlesetta Shanks, MD ?12/27/21 2357 ? ?

## 2021-12-27 NOTE — ED Triage Notes (Addendum)
Pt states he has been sick times one week for a "sinus' infection. Pt had a CT scan on Wednesday which pt states that confirmed a sinus infection. States tonight [facility] and was sent her for a "possible stroke" Pt states double vision with both eyes that started on Monday. Left eye started drooping on Wednesday. Denies any weakness, grips equal bilateral. 2 brisk on pupils bilateral, denies any gait issues. C/o dull h/a. Has been tested for covid which was neg. Has had tylenol this morning. Has been taking amoxicillin. Did have a "steroid injection" denies a hx of bells palsy  ?

## 2021-12-27 NOTE — Discharge Instructions (Signed)
1.  You were advised to get an MRI of the orbits and brain with and without contrast, through the emergency department during this visit.  At this time, due to long wait times you are choosing to go home and return for reassessment. ?2.  Return to emergency department immediately if you have any new or worsening symptoms. ?3.  Return to the emergency department as soon as possible to complete your treatment.  If you do not return to the emergency department, call your ophthalmologist or family doctor first thing Monday to resume evaluation. ?

## 2021-12-28 LAB — SEDIMENTATION RATE: Sed Rate: 4 mm/hr (ref 0–16)

## 2021-12-29 ENCOUNTER — Other Ambulatory Visit: Payer: Self-pay | Admitting: Internal Medicine

## 2021-12-29 ENCOUNTER — Ambulatory Visit (HOSPITAL_COMMUNITY)
Admission: RE | Admit: 2021-12-29 | Discharge: 2021-12-29 | Disposition: A | Discharge status: Home / Self Care | Payer: 59 | Source: Ambulatory Visit | Attending: Internal Medicine | Admitting: Internal Medicine

## 2021-12-29 ENCOUNTER — Other Ambulatory Visit (HOSPITAL_COMMUNITY): Payer: Self-pay | Admitting: Internal Medicine

## 2021-12-29 DIAGNOSIS — J3489 Other specified disorders of nose and nasal sinuses: Secondary | ICD-10-CM

## 2021-12-29 DIAGNOSIS — R519 Headache, unspecified: Secondary | ICD-10-CM | POA: Insufficient documentation

## 2021-12-29 MED ORDER — GADOBUTROL 1 MMOL/ML IV SOLN
10.0000 mL | Freq: Once | INTRAVENOUS | Status: AC | PRN
  Administered 2021-12-29: 10 mL via INTRAVENOUS

## 2022-01-05 ENCOUNTER — Ambulatory Visit: Payer: 59 | Admitting: Allergy

## 2022-02-22 NOTE — Progress Notes (Signed)
New Patient Note  RE: Phillip Hatfield MRN: [id number] DOB: [birth year] Date of Office Visit: 02/23/2022  Consult requested by: Haywood Pao, MD Primary care provider: Haywood Pao, MD  Chief Complaint: No chief complaint on file.  History of Present Illness: I had the pleasure of seeing Phillip Hatfield for initial evaluation at the [facility] [place] [place] on 02/22/2022. He is a 56 y.o. male, who is referred here by Tisovec, Fransico Him, MD for the evaluation of allergic rhinitis.  He reports symptoms of ***. Symptoms have been going on for *** years. The symptoms are present *** all year around with worsening in ***. Other triggers include exposure to ***. Anosmia: ***. Headache: ***. He has used *** with ***fair improvement in symptoms. Sinus infections: ***. Previous work up includes: ***. Previous ENT evaluation: ***. Previous sinus imaging: ***. History of nasal polyps: ***. Last eye exam: ***. History of reflux: ***.  Assessment and Plan: Jerrelle is a 56 y.o. male with: No problem-specific Assessment & Plan notes found for this encounter.  No follow-ups on file.  No orders of the defined types were placed in this encounter.  Lab Orders  No laboratory test(s) ordered today    Other allergy screening: Asthma: {Blank single:19197::"yes","no"} Rhino conjunctivitis: {Blank single:19197::"yes","no"} Food allergy: {Blank single:19197::"yes","no"} Medication allergy: {Blank single:19197::"yes","no"} Hymenoptera allergy: {Blank single:19197::"yes","no"} Urticaria: {Blank single:19197::"yes","no"} Eczema:{Blank single:19197::"yes","no"} History of recurrent infections suggestive of immunodeficency: {Blank single:19197::"yes","no"}  Diagnostics: Spirometry:  Tracings reviewed. His effort: {Blank single:19197::"Good reproducible efforts.","It was hard to get consistent efforts and there is a question as to whether this reflects a maximal maneuver.","Poor  effort, data can not be interpreted."} FVC: ***L FEV1: ***L, ***% predicted FEV1/FVC ratio: ***% Interpretation: {Blank single:19197::"Spirometry consistent with mild obstructive disease","Spirometry consistent with moderate obstructive disease","Spirometry consistent with severe obstructive disease","Spirometry consistent with possible restrictive disease","Spirometry consistent with mixed obstructive and restrictive disease","Spirometry uninterpretable due to technique","Spirometry consistent with normal pattern","No overt abnormalities noted given today's efforts"}.  Please see scanned spirometry results for details.  Skin Testing: {Blank single:19197::"Select foods","Environmental allergy panel","Environmental allergy panel and select foods","Food allergy panel","None","Deferred due to recent antihistamines use"}. *** Results discussed with patient/family.   Past Medical History: Patient Active Problem List   Diagnosis Date Noted  . Ingrown toenail 08/28/2021   Past Medical History:  Diagnosis Date  . Diabetes mellitus without complication [facility])    Past Surgical History: Past Surgical History:  Procedure Laterality Date  . APPENDECTOMY     Medication List:  Current Outpatient Medications  Medication Sig Dispense Refill  . ezetimibe-simvastatin (VYTORIN) 10-40 MG tablet Take 1 tablet by mouth daily.  6  . hydrochlorothiazide (HYDRODIURIL) 25 MG tablet Take 25 mg by mouth 2 (two) times daily.    Marland Kitchen JARDIANCE 25 MG TABS tablet Take 25 mg by mouth daily.    Marland Kitchen KOMBIGLYZE XR 2.01-999 MG TB24 Take 1 tablet by mouth 2 (two) times daily.  6  . LANTUS SOLOSTAR 100 UNIT/ML Solostar Pen Inject into the skin.    Marland Kitchen lisinopril (PRINIVIL,ZESTRIL) 20 MG tablet Take 20 mg by mouth 2 (two) times daily.  8  . montelukast (SINGULAIR) 10 MG tablet Take 10 mg by mouth daily.     No current facility-administered medications for this visit.   Allergies: Not on File Social History: Social History    Socioeconomic History  . Marital status: Married    Spouse name: Not on file  . Number of children: Not on file  . Years of education: Not  on file  . Highest education level: Not on file  Occupational History  . Not on file  Tobacco Use  . Smoking status: Never  . Smokeless tobacco: Never  Substance and Sexual Activity  . Alcohol use: Not Currently  . Drug use: Not Currently  . Sexual activity: Not on file  Other Topics Concern  . Not on file  Social History Narrative  . Not on file   Social Determinants of Health   Financial Resource Strain: Not on file  Food Insecurity: Not on file  Transportation Needs: Not on file  Physical Activity: Not on file  Stress: Not on file  Social Connections: Not on file   Lives in a ***. Smoking: *** Occupation: ***  Environmental HistoryFreight forwarder in the house: Estate agent in the family room: {Blank single:19197::"yes","no"} Carpet in the bedroom: {Blank single:19197::"yes","no"} Heating: {Blank single:19197::"electric","gas","heat pump"} Cooling: {Blank single:19197::"central","window","heat pump"} Pet: {Blank single:19197::"yes ***","no"}  Family History: No family history on file. Problem                               Relation Asthma                                   *** Eczema                                *** Food allergy                          *** Allergic rhino conjunctivitis     ***  Review of Systems  Constitutional:  Negative for appetite change, chills, fever and unexpected weight change.  HENT:  Negative for congestion and rhinorrhea.   Eyes:  Negative for itching.  Respiratory:  Negative for cough, chest tightness, shortness of breath and wheezing.   Cardiovascular:  Negative for chest pain.  Gastrointestinal:  Negative for abdominal pain.  Genitourinary:  Negative for difficulty urinating.  Skin:  Negative for rash.  Neurological:  Negative for headaches.    Objective: There were no vitals taken for this visit. There is no height or weight on file to calculate BMI. Physical Exam Vitals and nursing note reviewed.  Constitutional:      Appearance: Normal appearance. He is well-developed.  HENT:     Head: Normocephalic and atraumatic.     Right Ear: Tympanic membrane and external ear normal.     Left Ear: Tympanic membrane and external ear normal.     Nose: Nose normal.     Mouth/Throat:     Mouth: Mucous membranes are moist.     Pharynx: Oropharynx is clear.  Eyes:     Conjunctiva/sclera: Conjunctivae normal.  Cardiovascular:     Rate and Rhythm: Normal rate and regular rhythm.     Heart sounds: Normal heart sounds. No murmur heard.    No friction rub. No gallop.  Pulmonary:     Effort: Pulmonary effort is normal.     Breath sounds: Normal breath sounds. No wheezing, rhonchi or rales.  Musculoskeletal:     Cervical back: Neck supple.  Skin:    General: Skin is warm.     Findings: No rash.  Neurological:     Mental Status: He is alert and oriented to person,  place, and time.  Psychiatric:        Behavior: Behavior normal.  The plan was reviewed with the patient/family, and all questions/concerned were addressed.  It was my pleasure to see Jawaan today and participate in his care. Please feel free to contact me with any questions or concerns.  Sincerely,  Rexene Alberts, DO Allergy & Immunology  [facility] office: [place] office: [phone]

## 2022-02-23 ENCOUNTER — Encounter: Payer: Self-pay | Admitting: Allergy

## 2022-02-23 ENCOUNTER — Ambulatory Visit: Payer: 59 | Admitting: Allergy

## 2022-02-23 ENCOUNTER — Other Ambulatory Visit: Payer: Self-pay

## 2022-02-23 VITALS — BP 142/90 | HR 80 | Temp 98.7°F | Resp 16 | Ht 68.0 in | Wt 235.2 lb

## 2022-02-23 DIAGNOSIS — J3089 Other allergic rhinitis: Secondary | ICD-10-CM

## 2022-02-23 DIAGNOSIS — I10 Essential (primary) hypertension: Secondary | ICD-10-CM | POA: Diagnosis not present

## 2022-02-23 DIAGNOSIS — J301 Allergic rhinitis due to pollen: Secondary | ICD-10-CM | POA: Insufficient documentation

## 2022-02-23 DIAGNOSIS — K219 Gastro-esophageal reflux disease without esophagitis: Secondary | ICD-10-CM | POA: Diagnosis not present

## 2022-02-23 MED ORDER — EPINEPHRINE 0.3 MG/0.3ML IJ SOAJ
0.3000 mg | INTRAMUSCULAR | 2 refills | Status: DC | PRN
Start: 2022-02-23 — End: 2023-04-26

## 2022-02-23 NOTE — Assessment & Plan Note (Signed)
>>  ASSESSMENT AND PLAN FOR OTHER ALLERGIC RHINITIS WRITTEN ON 02/23/2022  4:53 PM BY Garnet Sierras, DO  Perennial rhinitis symptoms for many years.  Tried Singulair and Xyzal with good benefit.  Declines using nasal sprays due to history of epistaxis.  No prior ENT evaluation.  2023 CT sinus was unremarkable.  Today's skin testing showed: Positive to trees and grass.   Start environmental control measures as below.  Use over the counter antihistamines such as Zyrtec (cetirizine), Claritin (loratadine), Allegra (fexofenadine), or Xyzal (levocetirizine) daily as needed. May take twice a day during allergy flares. May switch antihistamines every few months.  Continue Singulair (montelukast) '10mg'$  daily at night.  Nasal saline spray (i.e., Simply Saline) or nasal saline lavage (i.e., NeilMed) is recommended as needed.  Start allergy injections.  Had a detailed discussion with patient/family that clinical history is suggestive of allergic rhinitis, and may benefit from allergy immunotherapy (AIT). Discussed in detail regarding the dosing, schedule, side effects (mild to moderate local allergic reaction and rarely systemic allergic reactions including anaphylaxis), and benefits (significant improvement in nasal symptoms, seasonal flares of asthma) of immunotherapy with the patient. There is significant time commitment involved with allergy shots, which includes weekly immunotherapy injections for first 9-12 months and then biweekly to monthly injections for 3-5 years. Consent was signed.  I have prescribed epinephrine injectable and demonstrated proper use. For mild symptoms you can take over the counter antihistamines such as Benadryl and monitor symptoms closely. If symptoms worsen or if you have severe symptoms including breathing issues, throat closure, significant swelling, whole body hives, severe diarrhea and vomiting, lightheadedness then inject epinephrine and seek immediate medical care afterwards.  Emergency action plan given.

## 2022-02-23 NOTE — Assessment & Plan Note (Signed)
Slightly up today - most likely because he didn't take his medications this morning.  . Monitor blood pressure. . Take medications as prescribed for this.

## 2022-02-23 NOTE — Assessment & Plan Note (Signed)
Perennial rhinitis symptoms for many years.  Tried Singulair and Xyzal with good benefit.  Declines using nasal sprays due to history of epistaxis.  No prior ENT evaluation.  2023 CT sinus was unremarkable.  Today's skin testing showed: Positive to trees and grass.   Start environmental control measures as below.  Use over the counter antihistamines such as Zyrtec (cetirizine), Claritin (loratadine), Allegra (fexofenadine), or Xyzal (levocetirizine) daily as needed. May take twice a day during allergy flares. May switch antihistamines every few months.  Continue Singulair (montelukast) '10mg'$  daily at night.  Nasal saline spray (i.e., Simply Saline) or nasal saline lavage (i.e., NeilMed) is recommended as needed.  Start allergy injections.  Had a detailed discussion with patient/family that clinical history is suggestive of allergic rhinitis, and may benefit from allergy immunotherapy (AIT). Discussed in detail regarding the dosing, schedule, side effects (mild to moderate local allergic reaction and rarely systemic allergic reactions including anaphylaxis), and benefits (significant improvement in nasal symptoms, seasonal flares of asthma) of immunotherapy with the patient. There is significant time commitment involved with allergy shots, which includes weekly immunotherapy injections for first 9-12 months and then biweekly to monthly injections for 3-5 years. Consent was signed.  I have prescribed epinephrine injectable and demonstrated proper use. For mild symptoms you can take over the counter antihistamines such as Benadryl and monitor symptoms closely. If symptoms worsen or if you have severe symptoms including breathing issues, throat closure, significant swelling, whole body hives, severe diarrhea and vomiting, lightheadedness then inject epinephrine and seek immediate medical care afterwards. Emergency action plan given.

## 2022-02-23 NOTE — Assessment & Plan Note (Addendum)
>>  ASSESSMENT AND PLAN FOR GASTROESOPHAGEAL REFLUX DISEASE WRITTEN ON 02/23/2022  4:54 PM BY Garnet Sierras, DO   See handout for lifestyle and dietary modifications.  Continue Nexium daily.  >>ASSESSMENT AND PLAN FOR HYPERTENSION WRITTEN ON 02/23/2022  4:54 PM BY Rexene Alberts M, DO  Slightly up today - most likely because he didn't take his medications this morning.  . Monitor blood pressure. . Take medications as prescribed for this.

## 2022-02-23 NOTE — Patient Instructions (Addendum)
Today's skin testing showed: Positive to trees and grass.   Results given.  Environmental allergies Start environmental control measures as below. Use over the counter antihistamines such as Zyrtec (cetirizine), Claritin (loratadine), Allegra (fexofenadine), or Xyzal (levocetirizine) daily as needed. May take twice a day during allergy flares. May switch antihistamines every few months. Continue Singulair (montelukast) '10mg'$  daily at night. Nasal saline spray (i.e., Simply Saline) or nasal saline lavage (i.e., NeilMed) is recommended as needed.  Start allergy injections. Had a detailed discussion with patient/family that clinical history is suggestive of allergic rhinitis, and may benefit from allergy immunotherapy (AIT). Discussed in detail regarding the dosing, schedule, side effects (mild to moderate local allergic reaction and rarely systemic allergic reactions including anaphylaxis), and benefits (significant improvement in nasal symptoms, seasonal flares of asthma) of immunotherapy with the patient. There is significant time commitment involved with allergy shots, which includes weekly immunotherapy injections for first 9-12 months and then biweekly to monthly injections for 3-5 years. Consent was signed. I have prescribed epinephrine injectable and demonstrated proper use. For mild symptoms you can take over the counter antihistamines such as Benadryl and monitor symptoms closely. If symptoms worsen or if you have severe symptoms including breathing issues, throat closure, significant swelling, whole body hives, severe diarrhea and vomiting, lightheadedness then inject epinephrine and seek immediate medical care afterwards. Emergency action plan given.  Heartburn: See handout for lifestyle and dietary modifications. Continue Nexium daily.  Blood pressure Slightly up today - most likely because you didn't take your medications this morning. Monitor blood pressure. Take medications as  prescribed for this.  Follow up in 6 months or sooner if needed.   Follow up in 3 weeks for first allergy shot.   Reducing Pollen Exposure Pollen seasons: trees (spring), grass (summer) and ragweed/weeds (fall). Keep windows closed in your home and car to lower pollen exposure.  Install air conditioning in the bedroom and throughout the house if possible.  Avoid going out in dry windy days - especially early morning. Pollen counts are highest between 5 - 10 AM and on dry, hot and windy days.  Save outside activities for late afternoon or after a heavy rain, when pollen levels are lower.  Avoid mowing of grass if you have grass pollen allergy. Be aware that pollen can also be transported indoors on people and pets.  Dry your clothes in an automatic dryer rather than hanging them outside where they might collect pollen.  Rinse hair and eyes before bedtime.

## 2022-02-24 DIAGNOSIS — J302 Other seasonal allergic rhinitis: Secondary | ICD-10-CM | POA: Diagnosis not present

## 2022-02-24 NOTE — Progress Notes (Signed)
Aeroallergen Immunotherapy   Ordering Provider: Dr. Rexene Alberts   Patient Details  Name: Phillip Hatfield  MRN: [id number]  Date of Birth: [birth year]   Order 1 of 1   Vial Label: T-G   0.3 ml (Volume)  BAU Concentration -- 7 Grass Mix* 100,000 ([address] [place], [place], [place], Perennial [place], [place], Sweet [place], [place])  0.5 ml (Volume)  1:20 Concentration -- Eastern 10 Tree Mix (also Sweet Gum)    0.8  ml Extract Subtotal  4.2  ml Diluent  5.0  ml Maintenance Total   Schedule:  B  Blue Vial (1:100,000): Schedule B (6 doses)  Yellow Vial (1:10,000): Schedule B (6 doses)  Green Vial (1:1,000): Schedule B (6 doses)  Red Vial (1:100): Schedule A (14 doses)   Special Instructions: once per week

## 2022-02-24 NOTE — Progress Notes (Signed)
VIALS EXP 02-25-23

## 2022-03-16 ENCOUNTER — Ambulatory Visit (INDEPENDENT_AMBULATORY_CARE_PROVIDER_SITE_OTHER): Payer: 59 | Admitting: *Deleted

## 2022-03-16 DIAGNOSIS — J309 Allergic rhinitis, unspecified: Secondary | ICD-10-CM

## 2022-03-16 NOTE — Progress Notes (Signed)
Immunotherapy   Patient Details  Name: Phillip Hatfield MRN: [id number] Date of Birth: 1965/10/26  03/16/2022  Phillip Hatfield started injections for  Grass-Tree Following schedule: B  Frequency:1 time per week Epi-Pen:Epi-Pen Available  Consent signed and patient instructions given. Waited 30 minutes with no problems.   Phillip Hatfield 03/16/2022, 11:49 AM

## 2022-03-23 ENCOUNTER — Ambulatory Visit (INDEPENDENT_AMBULATORY_CARE_PROVIDER_SITE_OTHER): Payer: 59

## 2022-03-23 DIAGNOSIS — J309 Allergic rhinitis, unspecified: Secondary | ICD-10-CM

## 2022-03-30 ENCOUNTER — Ambulatory Visit (INDEPENDENT_AMBULATORY_CARE_PROVIDER_SITE_OTHER): Payer: 59

## 2022-03-30 DIAGNOSIS — J309 Allergic rhinitis, unspecified: Secondary | ICD-10-CM

## 2022-04-06 ENCOUNTER — Ambulatory Visit (INDEPENDENT_AMBULATORY_CARE_PROVIDER_SITE_OTHER): Payer: 59

## 2022-04-06 DIAGNOSIS — J309 Allergic rhinitis, unspecified: Secondary | ICD-10-CM

## 2022-04-13 ENCOUNTER — Ambulatory Visit (INDEPENDENT_AMBULATORY_CARE_PROVIDER_SITE_OTHER): Payer: 59

## 2022-04-13 DIAGNOSIS — J309 Allergic rhinitis, unspecified: Secondary | ICD-10-CM | POA: Diagnosis not present

## 2022-04-20 ENCOUNTER — Ambulatory Visit (INDEPENDENT_AMBULATORY_CARE_PROVIDER_SITE_OTHER): Payer: 59

## 2022-04-20 DIAGNOSIS — J309 Allergic rhinitis, unspecified: Secondary | ICD-10-CM

## 2022-04-27 ENCOUNTER — Ambulatory Visit (INDEPENDENT_AMBULATORY_CARE_PROVIDER_SITE_OTHER): Payer: 59 | Admitting: *Deleted

## 2022-04-27 DIAGNOSIS — J309 Allergic rhinitis, unspecified: Secondary | ICD-10-CM | POA: Diagnosis not present

## 2022-05-06 ENCOUNTER — Ambulatory Visit (INDEPENDENT_AMBULATORY_CARE_PROVIDER_SITE_OTHER): Payer: 59 | Admitting: *Deleted

## 2022-05-06 DIAGNOSIS — J309 Allergic rhinitis, unspecified: Secondary | ICD-10-CM

## 2022-05-13 ENCOUNTER — Ambulatory Visit (INDEPENDENT_AMBULATORY_CARE_PROVIDER_SITE_OTHER): Payer: 59 | Admitting: *Deleted

## 2022-05-13 DIAGNOSIS — J309 Allergic rhinitis, unspecified: Secondary | ICD-10-CM | POA: Diagnosis not present

## 2022-05-18 ENCOUNTER — Ambulatory Visit (INDEPENDENT_AMBULATORY_CARE_PROVIDER_SITE_OTHER): Payer: 59

## 2022-05-18 DIAGNOSIS — J309 Allergic rhinitis, unspecified: Secondary | ICD-10-CM | POA: Diagnosis not present

## 2022-05-27 ENCOUNTER — Ambulatory Visit (INDEPENDENT_AMBULATORY_CARE_PROVIDER_SITE_OTHER): Payer: 59 | Admitting: *Deleted

## 2022-05-27 DIAGNOSIS — J309 Allergic rhinitis, unspecified: Secondary | ICD-10-CM

## 2022-06-03 ENCOUNTER — Ambulatory Visit (INDEPENDENT_AMBULATORY_CARE_PROVIDER_SITE_OTHER): Payer: 59 | Admitting: *Deleted

## 2022-06-03 DIAGNOSIS — J309 Allergic rhinitis, unspecified: Secondary | ICD-10-CM

## 2022-06-08 ENCOUNTER — Ambulatory Visit (INDEPENDENT_AMBULATORY_CARE_PROVIDER_SITE_OTHER): Payer: 59

## 2022-06-08 DIAGNOSIS — J309 Allergic rhinitis, unspecified: Secondary | ICD-10-CM | POA: Diagnosis not present

## 2022-06-15 ENCOUNTER — Ambulatory Visit (INDEPENDENT_AMBULATORY_CARE_PROVIDER_SITE_OTHER): Payer: 59

## 2022-06-15 DIAGNOSIS — J309 Allergic rhinitis, unspecified: Secondary | ICD-10-CM | POA: Diagnosis not present

## 2022-06-22 ENCOUNTER — Ambulatory Visit (INDEPENDENT_AMBULATORY_CARE_PROVIDER_SITE_OTHER): Payer: 59 | Admitting: *Deleted

## 2022-06-22 DIAGNOSIS — J309 Allergic rhinitis, unspecified: Secondary | ICD-10-CM | POA: Diagnosis not present

## 2022-06-29 ENCOUNTER — Ambulatory Visit (INDEPENDENT_AMBULATORY_CARE_PROVIDER_SITE_OTHER): Payer: 59

## 2022-06-29 DIAGNOSIS — J309 Allergic rhinitis, unspecified: Secondary | ICD-10-CM

## 2022-07-07 ENCOUNTER — Ambulatory Visit (INDEPENDENT_AMBULATORY_CARE_PROVIDER_SITE_OTHER): Payer: 59 | Admitting: *Deleted

## 2022-07-07 DIAGNOSIS — J309 Allergic rhinitis, unspecified: Secondary | ICD-10-CM

## 2022-07-13 ENCOUNTER — Ambulatory Visit (INDEPENDENT_AMBULATORY_CARE_PROVIDER_SITE_OTHER): Payer: 59

## 2022-07-13 DIAGNOSIS — J309 Allergic rhinitis, unspecified: Secondary | ICD-10-CM | POA: Diagnosis not present

## 2022-07-20 ENCOUNTER — Ambulatory Visit (INDEPENDENT_AMBULATORY_CARE_PROVIDER_SITE_OTHER): Payer: 59

## 2022-07-20 DIAGNOSIS — J309 Allergic rhinitis, unspecified: Secondary | ICD-10-CM

## 2022-07-27 ENCOUNTER — Ambulatory Visit (INDEPENDENT_AMBULATORY_CARE_PROVIDER_SITE_OTHER): Payer: 59 | Admitting: *Deleted

## 2022-07-27 DIAGNOSIS — J309 Allergic rhinitis, unspecified: Secondary | ICD-10-CM

## 2022-08-03 ENCOUNTER — Ambulatory Visit (INDEPENDENT_AMBULATORY_CARE_PROVIDER_SITE_OTHER): Payer: 59

## 2022-08-03 DIAGNOSIS — J309 Allergic rhinitis, unspecified: Secondary | ICD-10-CM | POA: Diagnosis not present

## 2022-08-10 ENCOUNTER — Ambulatory Visit (INDEPENDENT_AMBULATORY_CARE_PROVIDER_SITE_OTHER): Payer: 59

## 2022-08-10 DIAGNOSIS — J309 Allergic rhinitis, unspecified: Secondary | ICD-10-CM | POA: Diagnosis not present

## 2022-08-17 ENCOUNTER — Ambulatory Visit (INDEPENDENT_AMBULATORY_CARE_PROVIDER_SITE_OTHER): Payer: 59

## 2022-08-17 DIAGNOSIS — J309 Allergic rhinitis, unspecified: Secondary | ICD-10-CM

## 2022-08-25 ENCOUNTER — Ambulatory Visit (INDEPENDENT_AMBULATORY_CARE_PROVIDER_SITE_OTHER): Payer: 59

## 2022-08-25 DIAGNOSIS — J309 Allergic rhinitis, unspecified: Secondary | ICD-10-CM

## 2022-09-02 ENCOUNTER — Ambulatory Visit (INDEPENDENT_AMBULATORY_CARE_PROVIDER_SITE_OTHER): Payer: 59

## 2022-09-02 DIAGNOSIS — J309 Allergic rhinitis, unspecified: Secondary | ICD-10-CM

## 2022-09-14 ENCOUNTER — Ambulatory Visit: Payer: 59 | Admitting: Allergy

## 2022-09-20 NOTE — Progress Notes (Unsigned)
Follow Up Note  RE: Phillip Hatfield MRN: [id number] DOB: [birth year] Date of Office Visit: 09/21/2022  Referring provider: Haywood Pao, MD Primary care provider: Haywood Pao, MD  Chief Complaint: No chief complaint on file.  History of Present Illness: I had the pleasure of seeing Phillip Hatfield for a follow up visit at the [facility] on 09/20/2022. He is a 57 y.o. male, who is being followed for allergic rhinitis on AIT and GERD. His previous allergy office visit was on 02/23/2022 with Dr. Maudie Mercury. Today is a regular follow up visit.  03/16/2022   Barbra Sarks started injections for  Grass-Tree  Other allergic rhinitis Perennial rhinitis symptoms for many years.  Tried Singulair and Xyzal with good benefit.  Declines using nasal sprays due to history of epistaxis.  No prior ENT evaluation.  2023 CT sinus was unremarkable. Today's skin testing showed: Positive to trees and grass.  Start environmental control measures as below. Use over the counter antihistamines such as Zyrtec (cetirizine), Claritin (loratadine), Allegra (fexofenadine), or Xyzal (levocetirizine) daily as needed. May take twice a day during allergy flares. May switch antihistamines every few months. Continue Singulair (montelukast) '10mg'$  daily at night. Nasal saline spray (i.e., Simply Saline) or nasal saline lavage (i.e., NeilMed) is recommended as needed. Start allergy injections. Had a detailed discussion with patient/family that clinical history is suggestive of allergic rhinitis, and may benefit from allergy immunotherapy (AIT). Discussed in detail regarding the dosing, schedule, side effects (mild to moderate local allergic reaction and rarely systemic allergic reactions including anaphylaxis), and benefits (significant improvement in nasal symptoms, seasonal flares of asthma) of immunotherapy with the patient. There is significant time commitment involved with allergy shots, which  includes weekly immunotherapy injections for first 9-12 months and then biweekly to monthly injections for 3-5 years. Consent was signed. I have prescribed epinephrine injectable and demonstrated proper use. For mild symptoms you can take over the counter antihistamines such as Benadryl and monitor symptoms closely. If symptoms worsen or if you have severe symptoms including breathing issues, throat closure, significant swelling, whole body hives, severe diarrhea and vomiting, lightheadedness then inject epinephrine and seek immediate medical care afterwards. Emergency action plan given.   Gastroesophageal reflux disease See handout for lifestyle and dietary modifications. Continue Nexium daily.   Hypertension Slightly up today - most likely because he didn't take his medications this morning.  Monitor blood pressure. Take medications as prescribed for this.   Return in about 6 months (around 08/25/2022).  Assessment and Plan: Phillip Hatfield is a 57 y.o. male with: No problem-specific Assessment & Plan notes found for this encounter.  No follow-ups on file.  No orders of the defined types were placed in this encounter.  Lab Orders  No laboratory test(s) ordered today    Diagnostics: Spirometry:  Tracings reviewed. His effort: {Blank single:19197::"Good reproducible efforts.","It was hard to get consistent efforts and there is a question as to whether this reflects a maximal maneuver.","Poor effort, data can not be interpreted."} FVC: ***L FEV1: ***L, ***% predicted FEV1/FVC ratio: ***% Interpretation: {Blank single:19197::"Spirometry consistent with mild obstructive disease","Spirometry consistent with moderate obstructive disease","Spirometry consistent with severe obstructive disease","Spirometry consistent with possible restrictive disease","Spirometry consistent with mixed obstructive and restrictive disease","Spirometry uninterpretable due to technique","Spirometry consistent with  normal pattern","No overt abnormalities noted given today's efforts"}.  Please see scanned spirometry results for details.  Skin Testing: {Blank single:19197::"Select foods","Environmental allergy panel","Environmental allergy panel and select foods","Food allergy panel","None","Deferred due to recent antihistamines use"}. ***  Results discussed with patient/family.   Medication List:  Current Outpatient Medications  Medication Sig Dispense Refill   aspirin EC 81 MG tablet 1 tablet once daily Oral     EPINEPHrine 0.3 mg/0.3 mL IJ SOAJ injection Inject 0.3 mg into the muscle as needed for anaphylaxis. 1 each 2   Esomeprazole Magnesium (NEXIUM 24HR) 20 MG TBEC Take 1 tab daily Oral     ezetimibe (ZETIA) 10 MG tablet Take 10 mg by mouth daily.     glucose blood (FREESTYLE LITE) test strip USE TO TEST SUGAR ONCE DAILY for 30     hydrochlorothiazide (HYDRODIURIL) 25 MG tablet Take 25 mg by mouth 2 (two) times daily.     Insulin Pen Needle (B-D ULTRAFINE III SHORT PEN) 31G X 8 MM MISC use one needle daily wiith insulin E11.65 for 90 days     Insulin Pen Needle (BD PEN NEEDLE NANO 2ND GEN) 32G X 4 MM MISC USE TO INJECT TOUJEO AS DIRECTED (DX: ICD10: E11.29) for 90     JARDIANCE 25 MG TABS tablet Take 25 mg by mouth daily.     KOMBIGLYZE XR 2.01-999 MG TB24 Take 1 tablet by mouth 2 (two) times daily.  6   LANTUS SOLOSTAR 100 UNIT/ML Solostar Pen Inject into the skin.     levocetirizine (XYZAL) 5 MG tablet Take 1 tablet by mouth daily.     lisinopril (ZESTRIL) 40 MG tablet 1 tablet Oral Once a day for 90 days     meloxicam (MOBIC) 15 MG tablet Take 15 mg by mouth daily.     montelukast (SINGULAIR) 10 MG tablet Take 10 mg by mouth daily.     mupirocin ointment (BACTROBAN) 2 % SMARTSIG:1 Application Topical 2-3 Times Daily     simvastatin (ZOCOR) 40 MG tablet Take 40 mg by mouth at bedtime.     No current facility-administered medications for this visit.   Allergies: Allergies  Allergen  Reactions   Atenolol Anaphylaxis   I reviewed his past medical history, social history, family history, and environmental history and no significant changes have been reported from his previous visit.  Review of Systems  Constitutional:  Negative for appetite change, chills, fever and unexpected weight change.  HENT:  Positive for postnasal drip and rhinorrhea. Negative for sneezing.   Eyes:  Negative for itching.  Respiratory:  Negative for cough, chest tightness, shortness of breath and wheezing.   Cardiovascular:  Negative for chest pain.  Gastrointestinal:  Negative for abdominal pain.  Genitourinary:  Negative for difficulty urinating.  Skin:  Negative for rash.  Allergic/Immunologic: Positive for environmental allergies.  Neurological:  Negative for headaches.    Objective: There were no vitals taken for this visit. There is no height or weight on file to calculate BMI. Physical Exam Vitals and nursing note reviewed.  Constitutional:      Appearance: Normal appearance. He is well-developed.  HENT:     Head: Normocephalic and atraumatic.     Right Ear: Tympanic membrane and external ear normal.     Left Ear: Tympanic membrane and external ear normal.     Nose: Nose normal.     Mouth/Throat:     Mouth: Mucous membranes are moist.     Pharynx: Oropharynx is clear.  Eyes:     Conjunctiva/sclera: Conjunctivae normal.  Cardiovascular:     Rate and Rhythm: Normal rate and regular rhythm.     Heart sounds: Normal heart sounds. No murmur heard.    No friction rub.  No gallop.  Pulmonary:     Effort: Pulmonary effort is normal.     Breath sounds: Normal breath sounds. No wheezing, rhonchi or rales.  Musculoskeletal:     Cervical back: Neck supple.  Skin:    General: Skin is warm.     Findings: No rash.  Neurological:     Mental Status: He is alert and oriented to person, place, and time.  Psychiatric:        Behavior: Behavior normal.    Previous notes and tests were  reviewed. The plan was reviewed with the patient/family, and all questions/concerned were addressed.  It was my pleasure to see Parmvir today and participate in his care. Please feel free to contact me with any questions or concerns.  Sincerely,  Rexene Alberts, DO Allergy & Immunology  [facility] office: [place] office: [phone]

## 2022-09-21 ENCOUNTER — Ambulatory Visit: Payer: 59 | Admitting: Allergy

## 2022-09-21 ENCOUNTER — Other Ambulatory Visit: Payer: Self-pay

## 2022-09-21 ENCOUNTER — Encounter: Payer: Self-pay | Admitting: Allergy

## 2022-09-21 ENCOUNTER — Ambulatory Visit: Payer: Self-pay

## 2022-09-21 VITALS — BP 138/70 | HR 98 | Temp 97.5°F | Resp 20 | Ht 70.0 in | Wt 237.0 lb

## 2022-09-21 DIAGNOSIS — J309 Allergic rhinitis, unspecified: Secondary | ICD-10-CM

## 2022-09-21 DIAGNOSIS — K219 Gastro-esophageal reflux disease without esophagitis: Secondary | ICD-10-CM | POA: Diagnosis not present

## 2022-09-21 DIAGNOSIS — J301 Allergic rhinitis due to pollen: Secondary | ICD-10-CM

## 2022-09-21 NOTE — Assessment & Plan Note (Addendum)
Past history - Perennial rhinitis symptoms for many years.  Tried Singulair and Xyzal with good benefit.  Declines using nasal sprays due to history of epistaxis.  No prior ENT evaluation.  2023 CT sinus was unremarkable. 2023 skin testing showed: Positive to trees and grass.  Interim history - Started AIT on 03/16/2022 (GT) and doing well on it.  Continue environmental control measures as below. Use over the counter antihistamines such as Zyrtec (cetirizine), Claritin (loratadine), Allegra (fexofenadine), or Xyzal (levocetirizine) daily as needed. May take twice a day during allergy flares. May switch antihistamines every few months. May take Singulair (montelukast) '10mg'$  daily at night. Nasal saline spray (i.e., Simply Saline) or nasal saline lavage (i.e., NeilMed) is recommended as needed. Continue allergy injections - given today.  Make sure you take your allergy medication on the days of your injections.

## 2022-09-21 NOTE — Assessment & Plan Note (Signed)
Not controlled. Continue lifestyle and dietary modifications. May take Nexium twice a day for a few weeks and see if it helps. Make sure you go see your PCP for this.

## 2022-09-21 NOTE — Patient Instructions (Addendum)
Environmental allergies 2023 skin testing showed: Positive to trees and grass.  Continue environmental control measures as below. Use over the counter antihistamines such as Zyrtec (cetirizine), Claritin (loratadine), Allegra (fexofenadine), or Xyzal (levocetirizine) daily as needed. May take twice a day during allergy flares. May switch antihistamines every few months. May take Singulair (montelukast) '10mg'$  daily at night. Nasal saline spray (i.e., Simply Saline) or nasal saline lavage (i.e., NeilMed) is recommended as needed. Continue allergy injections - give today.  Make sure you take your allergy medication on the days of your injections.   Heartburn: Continue lifestyle and dietary modifications. May take Nexium twice a day for a few weeks and see if it helps. Make sure you go see your PCP for this.   Follow up in 6 months or sooner if needed.    Reducing Pollen Exposure Pollen seasons: trees (spring), grass (summer) and ragweed/weeds (fall). Keep windows closed in your home and car to lower pollen exposure.  Install air conditioning in the bedroom and throughout the house if possible.  Avoid going out in dry windy days - especially early morning. Pollen counts are highest between 5 - 10 AM and on dry, hot and windy days.  Save outside activities for late afternoon or after a heavy rain, when pollen levels are lower.  Avoid mowing of grass if you have grass pollen allergy. Be aware that pollen can also be transported indoors on people and pets.  Dry your clothes in an automatic dryer rather than hanging them outside where they might collect pollen.  Rinse hair and eyes before bedtime.

## 2022-09-28 ENCOUNTER — Ambulatory Visit (INDEPENDENT_AMBULATORY_CARE_PROVIDER_SITE_OTHER): Payer: 59

## 2022-09-28 DIAGNOSIS — J309 Allergic rhinitis, unspecified: Secondary | ICD-10-CM | POA: Diagnosis not present

## 2022-10-05 ENCOUNTER — Ambulatory Visit (INDEPENDENT_AMBULATORY_CARE_PROVIDER_SITE_OTHER): Payer: 59

## 2022-10-05 DIAGNOSIS — J309 Allergic rhinitis, unspecified: Secondary | ICD-10-CM | POA: Diagnosis not present

## 2022-10-06 NOTE — Progress Notes (Signed)
VIAL EXP 10-07-23

## 2022-10-12 ENCOUNTER — Ambulatory Visit (INDEPENDENT_AMBULATORY_CARE_PROVIDER_SITE_OTHER): Payer: 59

## 2022-10-12 DIAGNOSIS — J309 Allergic rhinitis, unspecified: Secondary | ICD-10-CM | POA: Diagnosis not present

## 2022-10-19 ENCOUNTER — Ambulatory Visit (INDEPENDENT_AMBULATORY_CARE_PROVIDER_SITE_OTHER): Payer: 59 | Admitting: *Deleted

## 2022-10-19 DIAGNOSIS — J309 Allergic rhinitis, unspecified: Secondary | ICD-10-CM

## 2022-10-26 ENCOUNTER — Ambulatory Visit (INDEPENDENT_AMBULATORY_CARE_PROVIDER_SITE_OTHER): Payer: 59

## 2022-10-26 DIAGNOSIS — J309 Allergic rhinitis, unspecified: Secondary | ICD-10-CM | POA: Diagnosis not present

## 2022-11-09 ENCOUNTER — Ambulatory Visit (INDEPENDENT_AMBULATORY_CARE_PROVIDER_SITE_OTHER): Payer: 59 | Admitting: *Deleted

## 2022-11-09 DIAGNOSIS — J309 Allergic rhinitis, unspecified: Secondary | ICD-10-CM

## 2022-11-16 ENCOUNTER — Ambulatory Visit (INDEPENDENT_AMBULATORY_CARE_PROVIDER_SITE_OTHER): Payer: 59 | Admitting: *Deleted

## 2022-11-16 DIAGNOSIS — J309 Allergic rhinitis, unspecified: Secondary | ICD-10-CM | POA: Diagnosis not present

## 2022-11-30 ENCOUNTER — Ambulatory Visit (INDEPENDENT_AMBULATORY_CARE_PROVIDER_SITE_OTHER): Payer: 59 | Admitting: *Deleted

## 2022-11-30 DIAGNOSIS — J309 Allergic rhinitis, unspecified: Secondary | ICD-10-CM | POA: Diagnosis not present

## 2022-12-07 ENCOUNTER — Ambulatory Visit (INDEPENDENT_AMBULATORY_CARE_PROVIDER_SITE_OTHER): Payer: 59 | Admitting: *Deleted

## 2022-12-07 DIAGNOSIS — J309 Allergic rhinitis, unspecified: Secondary | ICD-10-CM

## 2022-12-14 ENCOUNTER — Ambulatory Visit (INDEPENDENT_AMBULATORY_CARE_PROVIDER_SITE_OTHER): Payer: 59 | Admitting: *Deleted

## 2022-12-14 DIAGNOSIS — J309 Allergic rhinitis, unspecified: Secondary | ICD-10-CM

## 2022-12-21 ENCOUNTER — Ambulatory Visit (INDEPENDENT_AMBULATORY_CARE_PROVIDER_SITE_OTHER): Payer: 59

## 2022-12-21 DIAGNOSIS — J309 Allergic rhinitis, unspecified: Secondary | ICD-10-CM | POA: Diagnosis not present

## 2022-12-28 ENCOUNTER — Ambulatory Visit (INDEPENDENT_AMBULATORY_CARE_PROVIDER_SITE_OTHER): Payer: 59

## 2022-12-28 DIAGNOSIS — J309 Allergic rhinitis, unspecified: Secondary | ICD-10-CM

## 2023-01-04 ENCOUNTER — Ambulatory Visit (INDEPENDENT_AMBULATORY_CARE_PROVIDER_SITE_OTHER): Payer: 59

## 2023-01-04 DIAGNOSIS — J309 Allergic rhinitis, unspecified: Secondary | ICD-10-CM

## 2023-01-11 ENCOUNTER — Ambulatory Visit (INDEPENDENT_AMBULATORY_CARE_PROVIDER_SITE_OTHER): Payer: 59 | Admitting: *Deleted

## 2023-01-11 DIAGNOSIS — J309 Allergic rhinitis, unspecified: Secondary | ICD-10-CM | POA: Diagnosis not present

## 2023-01-18 ENCOUNTER — Ambulatory Visit (INDEPENDENT_AMBULATORY_CARE_PROVIDER_SITE_OTHER): Payer: 59 | Admitting: *Deleted

## 2023-01-18 DIAGNOSIS — J309 Allergic rhinitis, unspecified: Secondary | ICD-10-CM | POA: Diagnosis not present

## 2023-01-25 ENCOUNTER — Ambulatory Visit (INDEPENDENT_AMBULATORY_CARE_PROVIDER_SITE_OTHER): Payer: 59 | Admitting: *Deleted

## 2023-01-25 DIAGNOSIS — J309 Allergic rhinitis, unspecified: Secondary | ICD-10-CM

## 2023-01-26 DIAGNOSIS — J302 Other seasonal allergic rhinitis: Secondary | ICD-10-CM | POA: Diagnosis not present

## 2023-01-26 NOTE — Progress Notes (Signed)
VIAL EXP 01-26-24

## 2023-02-08 ENCOUNTER — Ambulatory Visit (INDEPENDENT_AMBULATORY_CARE_PROVIDER_SITE_OTHER): Payer: 59 | Admitting: *Deleted

## 2023-02-08 DIAGNOSIS — J309 Allergic rhinitis, unspecified: Secondary | ICD-10-CM

## 2023-02-15 ENCOUNTER — Ambulatory Visit (INDEPENDENT_AMBULATORY_CARE_PROVIDER_SITE_OTHER): Payer: 59 | Admitting: *Deleted

## 2023-02-15 DIAGNOSIS — J309 Allergic rhinitis, unspecified: Secondary | ICD-10-CM | POA: Diagnosis not present

## 2023-02-22 ENCOUNTER — Ambulatory Visit (INDEPENDENT_AMBULATORY_CARE_PROVIDER_SITE_OTHER): Payer: 59 | Admitting: *Deleted

## 2023-02-22 DIAGNOSIS — J309 Allergic rhinitis, unspecified: Secondary | ICD-10-CM

## 2023-03-01 ENCOUNTER — Ambulatory Visit (INDEPENDENT_AMBULATORY_CARE_PROVIDER_SITE_OTHER): Payer: 59 | Admitting: *Deleted

## 2023-03-01 DIAGNOSIS — J309 Allergic rhinitis, unspecified: Secondary | ICD-10-CM

## 2023-03-08 ENCOUNTER — Ambulatory Visit (INDEPENDENT_AMBULATORY_CARE_PROVIDER_SITE_OTHER): Payer: 59

## 2023-03-08 DIAGNOSIS — J309 Allergic rhinitis, unspecified: Secondary | ICD-10-CM

## 2023-03-15 ENCOUNTER — Ambulatory Visit (INDEPENDENT_AMBULATORY_CARE_PROVIDER_SITE_OTHER): Payer: 59

## 2023-03-15 DIAGNOSIS — J309 Allergic rhinitis, unspecified: Secondary | ICD-10-CM | POA: Diagnosis not present

## 2023-03-22 ENCOUNTER — Other Ambulatory Visit: Payer: Self-pay

## 2023-03-22 ENCOUNTER — Ambulatory Visit: Payer: 59 | Admitting: Allergy

## 2023-03-22 ENCOUNTER — Ambulatory Visit: Payer: Self-pay | Admitting: *Deleted

## 2023-03-22 ENCOUNTER — Encounter: Payer: Self-pay | Admitting: Allergy

## 2023-03-22 VITALS — BP 142/76 | HR 76 | Temp 98.4°F | Resp 20 | Ht 67.72 in | Wt 331.8 lb

## 2023-03-22 DIAGNOSIS — R030 Elevated blood-pressure reading, without diagnosis of hypertension: Secondary | ICD-10-CM | POA: Diagnosis not present

## 2023-03-22 DIAGNOSIS — K219 Gastro-esophageal reflux disease without esophagitis: Secondary | ICD-10-CM

## 2023-03-22 DIAGNOSIS — J309 Allergic rhinitis, unspecified: Secondary | ICD-10-CM

## 2023-03-22 DIAGNOSIS — J301 Allergic rhinitis due to pollen: Secondary | ICD-10-CM | POA: Diagnosis not present

## 2023-03-22 DIAGNOSIS — R499 Unspecified voice and resonance disorder: Secondary | ICD-10-CM | POA: Diagnosis not present

## 2023-03-22 NOTE — Assessment & Plan Note (Signed)
Requesting ENT referral. Discussed this may be due to his GERD too. Dr. Rubye Oaks is a vocal cord specialist.

## 2023-03-22 NOTE — Assessment & Plan Note (Signed)
Better with Nexium 20mg  BID. No EGD. Continue lifestyle and dietary modifications. May take Nexium 20mg  twice a day. Follow up with PCP and ask about GI referral for EGD.

## 2023-03-22 NOTE — Patient Instructions (Addendum)
Environmental allergies 2023 skin testing showed: Positive to trees and grass.  Continue environmental control measures as below. Use over the counter antihistamines such as Zyrtec (cetirizine), Claritin (loratadine), Allegra (fexofenadine), or Xyzal (levocetirizine) daily as needed. May take twice a day during allergy flares. May switch antihistamines every few months. Nasal saline spray (i.e., Simply Saline) or nasal saline lavage (i.e., NeilMed) is recommended as needed. Continue allergy injections - given today.  Make sure you take your allergy medication on the days of your injections.   Heartburn: Continue lifestyle and dietary modifications. May take Nexium 20mg  twice a day. Follow up with PCP and ask about GI referral for EGD.   Vocal cord Dr. Rubye Oaks is a vocal cord specialist. [phone]  Elevated blood pressure Follow up with PCP regarding this.   Follow up in 12 months or sooner if needed.    Reducing Pollen Exposure Pollen seasons: trees (spring), grass (summer) and ragweed/weeds (fall). Keep windows closed in your home and car to lower pollen exposure.  Install air conditioning in the bedroom and throughout the house if possible.  Avoid going out in dry windy days - especially early morning. Pollen counts are highest between 5 - 10 AM and on dry, hot and windy days.  Save outside activities for late afternoon or after a heavy rain, when pollen levels are lower.  Avoid mowing of grass if you have grass pollen allergy. Be aware that pollen can also be transported indoors on people and pets.  Dry your clothes in an automatic dryer rather than hanging them outside where they might collect pollen.  Rinse hair and eyes before bedtime.

## 2023-03-22 NOTE — Assessment & Plan Note (Signed)
Past history - Declines nasal sprays due to epistaxis.  No prior ENT evaluation.  2023 CT sinus unremarkable. 2023 skin testing showed: Positive to trees and grass. Started AIT on 03/16/2022 (GT). Interim history -  symptoms improved with AIT. Some sneezing and rhinorrhea.  Continue environmental control measures as below. Use over the counter antihistamines such as Zyrtec (cetirizine), Claritin (loratadine), Allegra (fexofenadine), or Xyzal (levocetirizine) daily as needed. May take twice a day during allergy flares. May switch antihistamines every few months. Nasal saline spray (i.e., Simply Saline) or nasal saline lavage (i.e., NeilMed) is recommended as needed. Continue allergy injections - given today.  Make sure you take your allergy medication on the days of your injections.

## 2023-03-22 NOTE — Progress Notes (Signed)
Follow Up Note  RE: Phillip Hatfield MRN: [id number] DOB: 1965-10-11 Date of Office Visit: 03/22/2023  Referring provider: Gaspar Garbe, MD Primary care provider: Gaspar Garbe, MD  Chief Complaint: Allergic Rhinitis  and Follow-up  History of Present Illness: I had the pleasure of seeing Phillip Hatfield for a follow up visit at the [facility] on 03/22/2023. He is a 57 y.o. male, who is being followed for allergic rhinitis on AIT and GERD. His previous allergy office visit was on 09/21/2022 with Dr. Selena Batten. Today is a regular follow up visit.  Seasonal allergic rhinitis due to pollen Thinks his allergies are doing better this year. Still has some sneezing and rhinorrhea episodes. But does not like to use nasal sprays due to h/o epistaxis.   Still doing allergy injections with no issues.  Only takes Xyzal on the days of injections.    Gastroesophageal reflux disease Takes Nexium twice a day which is helping.   Noted some voice changes and wants to see ENT first before going to GI.  Assessment and Plan: [name] is a 57 y.o. male with: Seasonal allergic rhinitis due to pollen Past history - Declines nasal sprays due to epistaxis.  No prior ENT evaluation.  2023 CT sinus unremarkable. 2023 skin testing showed: Positive to trees and grass. Started AIT on 03/16/2022 (GT). Interim history -  symptoms improved with AIT. Some sneezing and rhinorrhea.  Continue environmental control measures as below. Use over the counter antihistamines such as Zyrtec (cetirizine), Claritin (loratadine), Allegra (fexofenadine), or Xyzal (levocetirizine) daily as needed. May take twice a day during allergy flares. May switch antihistamines every few months. Nasal saline spray (i.e., Simply Saline) or nasal saline lavage (i.e., NeilMed) is recommended as needed. Continue allergy injections - given today.  Make sure you take your allergy medication on the days of your  injections.   Gastroesophageal reflux disease Better with Nexium 20mg  BID. No EGD. Continue lifestyle and dietary modifications. May take Nexium 20mg  twice a day. Follow up with PCP and ask about GI referral for EGD.  Change in voice Requesting ENT referral. Discussed this may be due to his GERD too. Dr. Rubye Oaks is a vocal cord specialist.  Elevated blood pressure reading Follow up with PCP regarding this.   Return in about 1 year (around 03/21/2024).  No orders of the defined types were placed in this encounter.  Lab Orders  No laboratory test(s) ordered today    Diagnostics: None.   Medication List:  Current Outpatient Medications  Medication Sig Dispense Refill   aspirin EC 81 MG tablet 1 tablet once daily Oral     EPINEPHrine 0.3 mg/0.3 mL IJ SOAJ injection Inject 0.3 mg into the muscle as needed for anaphylaxis. 1 each 2   Esomeprazole Magnesium (NEXIUM 24HR) 20 MG TBEC Take 1 tab daily Oral     ezetimibe (ZETIA) 10 MG tablet Take 10 mg by mouth daily.     glucose blood (FREESTYLE LITE) test strip USE TO TEST SUGAR ONCE DAILY for 30     hydrochlorothiazide (HYDRODIURIL) 25 MG tablet Take 25 mg by mouth 2 (two) times daily.     Insulin Pen Needle (B-D ULTRAFINE III SHORT PEN) 31G X 8 MM MISC use one needle daily wiith insulin E11.65 for 90 days     Insulin Pen Needle (BD PEN NEEDLE NANO 2ND GEN) 32G X 4 MM MISC USE TO INJECT TOUJEO AS DIRECTED (DX: ICD10: E11.29) for 90  JARDIANCE 25 MG TABS tablet Take 25 mg by mouth daily.     LANTUS SOLOSTAR 100 UNIT/ML Solostar Pen Inject into the skin.     levocetirizine (XYZAL) 5 MG tablet Take 1 tablet by mouth daily.     lisinopril (ZESTRIL) 40 MG tablet 1 tablet Oral Once a day for 90 days     meloxicam (MOBIC) 15 MG tablet Take 15 mg by mouth daily.     mupirocin ointment (BACTROBAN) 2 % SMARTSIG:1 Application Topical 2-3 Times Daily     simvastatin (ZOCOR) 40 MG tablet Take 40 mg by mouth at bedtime.     KOMBIGLYZE XR  2.01-999 MG TB24 Take 1 tablet by mouth 2 (two) times daily. (Patient not taking: Reported on 03/22/2023)  6   No current facility-administered medications for this visit.   Allergies: Allergies  Allergen Reactions   Atenolol Anaphylaxis   I reviewed his past medical history, social history, family history, and environmental history and no significant changes have been reported from his previous visit.  Review of Systems  Constitutional:  Negative for appetite change, chills, fever and unexpected weight change.  HENT:  Positive for rhinorrhea, sneezing and voice change. Negative for postnasal drip.   Eyes:  Negative for itching.  Respiratory:  Negative for cough, chest tightness, shortness of breath and wheezing.   Cardiovascular:  Negative for chest pain.  Gastrointestinal:  Negative for abdominal pain.  Genitourinary:  Negative for difficulty urinating.  Skin:  Negative for rash.  Allergic/Immunologic: Positive for environmental allergies.  Neurological:  Negative for headaches.    Objective: BP (!) 142/76 Comment: Pt refused BP recheck.  Pulse 76   Temp 98.4 F (36.9 C) (Temporal)   Resp 20   Ht 5' 7.72" (1.72 m)   Wt (!) 331 lb 12.8 oz (150.5 kg)   SpO2 97%   BMI 50.87 kg/m  Body mass index is 50.87 kg/m. Physical Exam Vitals and nursing note reviewed.  Constitutional:      Appearance: Normal appearance. He is well-developed.  HENT:     Head: Normocephalic and atraumatic.     Right Ear: Tympanic membrane and external ear normal.     Left Ear: Tympanic membrane and external ear normal.     Nose: Nose normal.     Mouth/Throat:     Mouth: Mucous membranes are moist.     Pharynx: Oropharynx is clear.  Eyes:     Conjunctiva/sclera: Conjunctivae normal.  Cardiovascular:     Rate and Rhythm: Normal rate and regular rhythm.     Heart sounds: Normal heart sounds. No murmur heard.    No friction rub. No gallop.  Pulmonary:     Effort: Pulmonary effort is normal.      Breath sounds: Normal breath sounds. No wheezing, rhonchi or rales.  Musculoskeletal:     Cervical back: Neck supple.  Skin:    General: Skin is warm.     Findings: No rash.  Neurological:     Mental Status: He is alert and oriented to person, place, and time.  Psychiatric:        Behavior: Behavior normal.   Previous notes and tests were reviewed. The plan was reviewed with the patient/family, and all questions/concerned were addressed.  It was my pleasure to see Etienne today and participate in his care. Please feel free to contact me with any questions or concerns.  Sincerely,  Wyline Mood, DO Allergy & Immunology  [facility] office:  [phone] [facility] office: [phone]

## 2023-03-29 ENCOUNTER — Ambulatory Visit (INDEPENDENT_AMBULATORY_CARE_PROVIDER_SITE_OTHER): Payer: 59

## 2023-03-29 DIAGNOSIS — J309 Allergic rhinitis, unspecified: Secondary | ICD-10-CM

## 2023-04-12 ENCOUNTER — Ambulatory Visit (INDEPENDENT_AMBULATORY_CARE_PROVIDER_SITE_OTHER): Payer: 59

## 2023-04-12 DIAGNOSIS — J309 Allergic rhinitis, unspecified: Secondary | ICD-10-CM

## 2023-04-26 ENCOUNTER — Ambulatory Visit (INDEPENDENT_AMBULATORY_CARE_PROVIDER_SITE_OTHER): Payer: 59

## 2023-04-26 DIAGNOSIS — J309 Allergic rhinitis, unspecified: Secondary | ICD-10-CM | POA: Diagnosis not present

## 2023-04-26 MED ORDER — EPINEPHRINE 0.3 MG/0.3ML IJ SOAJ: 0.3000 mg | INTRAMUSCULAR | 2 refills | Status: DC | PRN

## 2023-04-26 NOTE — Addendum Note (Signed)
Addended by: Briant Cedar L on: 04/26/2023 10:20 AM   Modules accepted: Orders

## 2023-05-17 ENCOUNTER — Ambulatory Visit (INDEPENDENT_AMBULATORY_CARE_PROVIDER_SITE_OTHER): Payer: 59

## 2023-05-17 DIAGNOSIS — J309 Allergic rhinitis, unspecified: Secondary | ICD-10-CM

## 2023-05-25 DIAGNOSIS — J302 Other seasonal allergic rhinitis: Secondary | ICD-10-CM | POA: Diagnosis not present

## 2023-05-25 NOTE — Progress Notes (Signed)
VIAL EXP 05-24-24

## 2023-06-07 ENCOUNTER — Ambulatory Visit (INDEPENDENT_AMBULATORY_CARE_PROVIDER_SITE_OTHER): Payer: 59

## 2023-06-07 DIAGNOSIS — J309 Allergic rhinitis, unspecified: Secondary | ICD-10-CM | POA: Diagnosis not present

## 2023-06-21 ENCOUNTER — Ambulatory Visit (INDEPENDENT_AMBULATORY_CARE_PROVIDER_SITE_OTHER): Payer: 59

## 2023-06-21 DIAGNOSIS — J309 Allergic rhinitis, unspecified: Secondary | ICD-10-CM

## 2023-07-05 ENCOUNTER — Ambulatory Visit (INDEPENDENT_AMBULATORY_CARE_PROVIDER_SITE_OTHER): Payer: 59 | Admitting: *Deleted

## 2023-07-05 DIAGNOSIS — J309 Allergic rhinitis, unspecified: Secondary | ICD-10-CM | POA: Diagnosis not present

## 2023-07-19 ENCOUNTER — Ambulatory Visit (INDEPENDENT_AMBULATORY_CARE_PROVIDER_SITE_OTHER): Payer: 59

## 2023-07-19 DIAGNOSIS — J309 Allergic rhinitis, unspecified: Secondary | ICD-10-CM | POA: Diagnosis not present

## 2023-07-26 ENCOUNTER — Ambulatory Visit (INDEPENDENT_AMBULATORY_CARE_PROVIDER_SITE_OTHER): Payer: 59 | Admitting: *Deleted

## 2023-07-26 DIAGNOSIS — J309 Allergic rhinitis, unspecified: Secondary | ICD-10-CM

## 2023-08-02 ENCOUNTER — Ambulatory Visit (INDEPENDENT_AMBULATORY_CARE_PROVIDER_SITE_OTHER): Payer: 59 | Admitting: *Deleted

## 2023-08-02 DIAGNOSIS — J309 Allergic rhinitis, unspecified: Secondary | ICD-10-CM

## 2023-08-30 ENCOUNTER — Ambulatory Visit (INDEPENDENT_AMBULATORY_CARE_PROVIDER_SITE_OTHER): Payer: 59

## 2023-08-30 DIAGNOSIS — J309 Allergic rhinitis, unspecified: Secondary | ICD-10-CM

## 2023-09-27 ENCOUNTER — Ambulatory Visit (INDEPENDENT_AMBULATORY_CARE_PROVIDER_SITE_OTHER): Payer: 59 | Admitting: *Deleted

## 2023-09-27 DIAGNOSIS — J309 Allergic rhinitis, unspecified: Secondary | ICD-10-CM

## 2023-10-25 ENCOUNTER — Ambulatory Visit (INDEPENDENT_AMBULATORY_CARE_PROVIDER_SITE_OTHER): Payer: 59

## 2023-10-25 DIAGNOSIS — J309 Allergic rhinitis, unspecified: Secondary | ICD-10-CM

## 2023-11-22 ENCOUNTER — Ambulatory Visit (INDEPENDENT_AMBULATORY_CARE_PROVIDER_SITE_OTHER): Admitting: *Deleted

## 2023-11-22 DIAGNOSIS — J309 Allergic rhinitis, unspecified: Secondary | ICD-10-CM

## 2023-12-14 IMAGING — MR MR ORBITS WO/W CM
7 series · 41 of 48 positions shown · IV contrast (gadavist)
Comparison: No prior MRI, correlation is made with CT maxillofacial
12/24/2021

CLINICAL DATA: Sinus infection concern for stroke, double vision

EXAM:
MRI HEAD AND ORBITS WITHOUT AND WITH CONTRAST
TECHNIQUE: Multiplanar, multiecho pulse sequences of the brain and surrounding
structures were obtained without and with intravenous contrast.
Multiplanar, multiecho pulse sequences of the orbits and surrounding
structures were obtained including fat saturation techniques, before
and after intravenous contrast administration.
CONTRAST:  10mL GADAVIST GADOBUTROL 1 MMOL/ML IV SOLN

[Series 9: T1 · sagittal · 3.0mm · 0.75mm/px · 9 of 32 slices shown (1 of 3)]
[im 1/32]
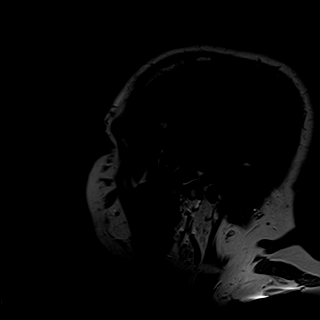
[im 4/32]
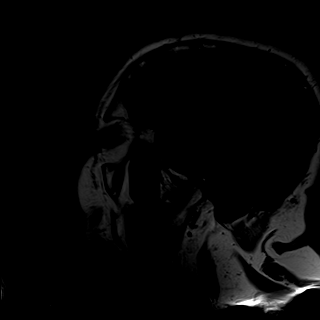
[im 8/32]
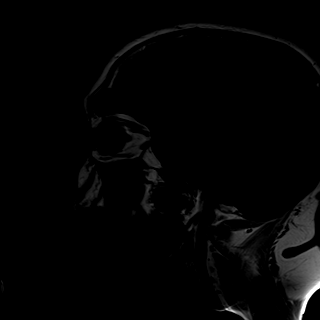
[im 12/32]
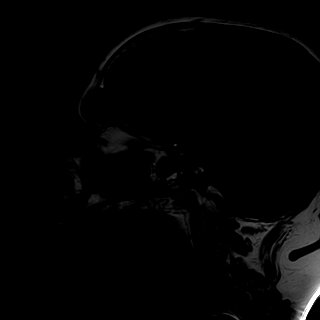
[im 16/32]
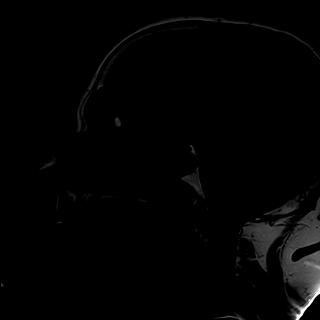
[im 20/32]
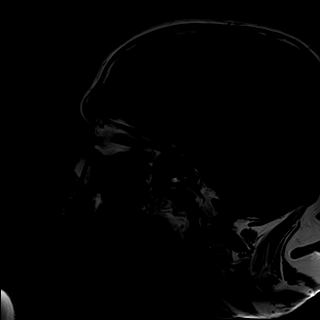
[im 24/32]
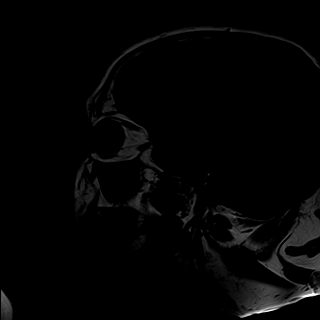
[im 28/32]
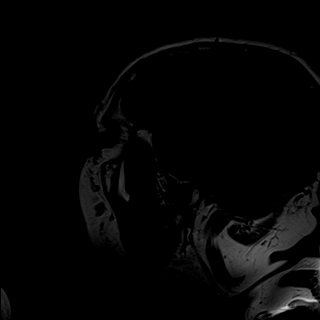
[im 32/32]
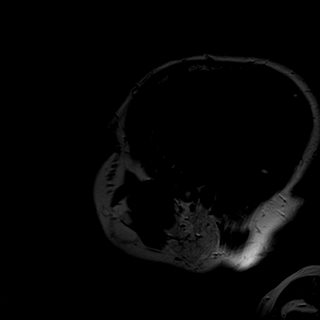

[Series 10: T2 fat-sat · axial · 3.0mm · 0.47mm/px · z∈[-64,-9]mm · 5 of 20 slices shown (1 of 2)]
[im 1/20]
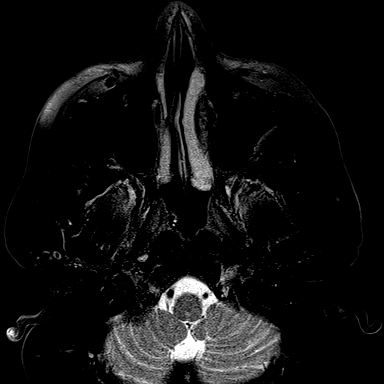
[im 5/20]
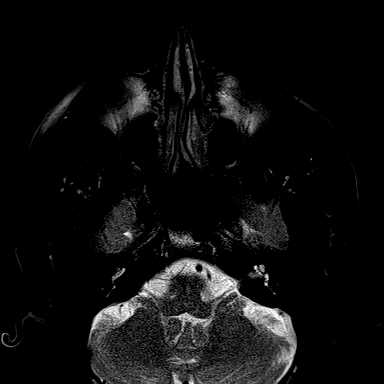
[im 10/20]
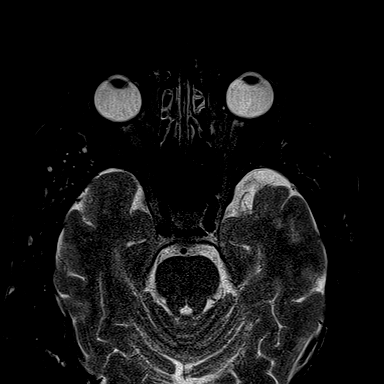
[im 15/20]
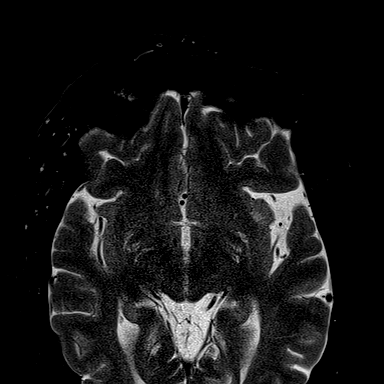
[im 20/20]
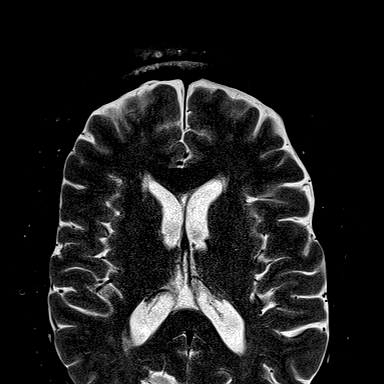

[Series 11: T1 · axial · 3.0mm · 0.56mm/px · z∈[-64,-9]mm · 5 of 20 slices shown (2 of 3)]
[im 1/20]
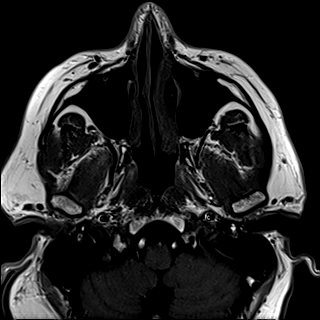
[im 5/20]
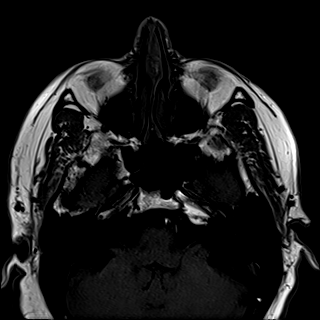
[im 10/20]
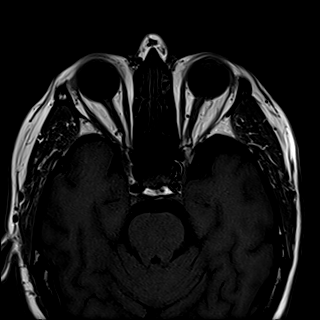
[im 15/20]
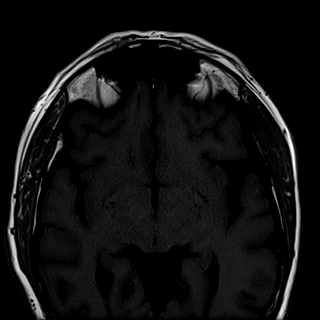
[im 20/20]
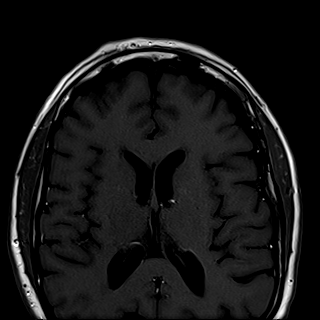

[Series 12: T2 fat-sat · coronal · 3.0mm · 0.47mm/px · 8 of 32 slices shown (2 of 2)]
[im 1/32]
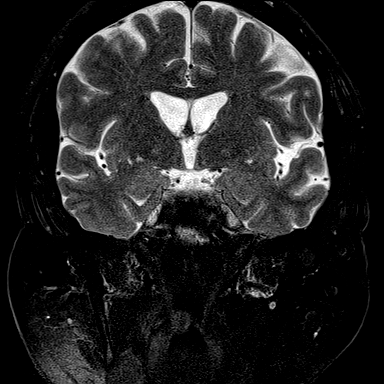
[im 5/32]
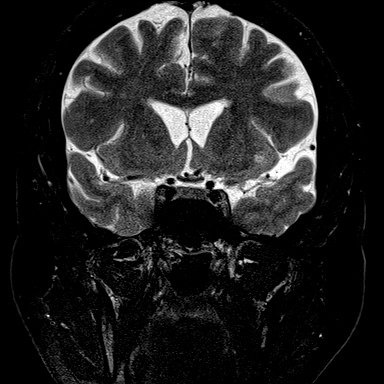
[im 9/32]
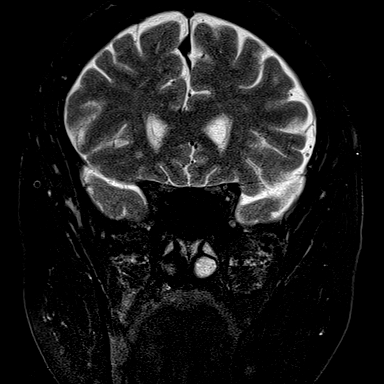
[im 14/32]
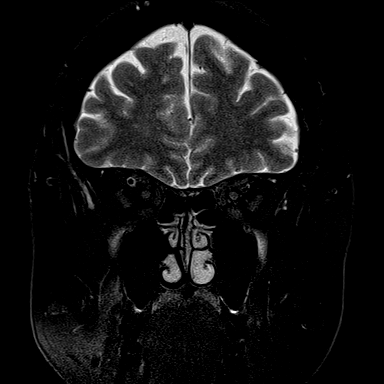
[im 18/32]
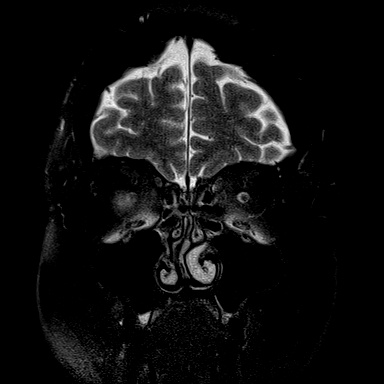
[im 23/32]
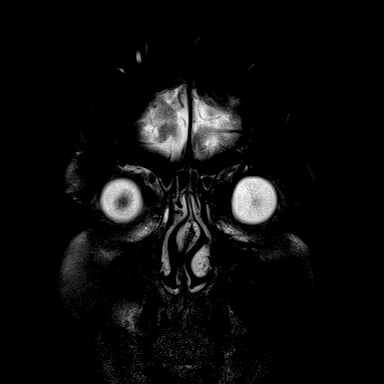
[im 27/32]
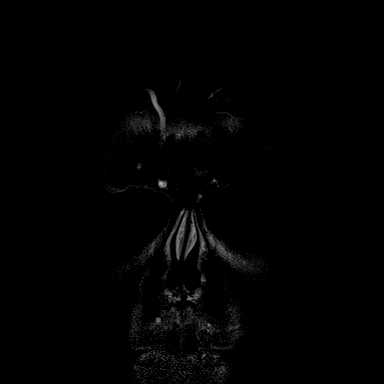
[im 32/32]
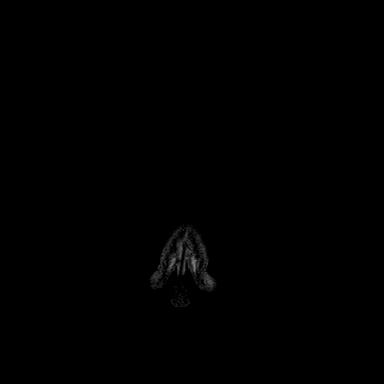

[Series 13: T1 · coronal · 3.0mm · 0.56mm/px · 8 of 32 slices shown (3 of 3)]
[im 1/32]
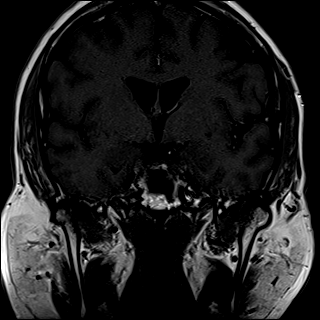
[im 5/32]
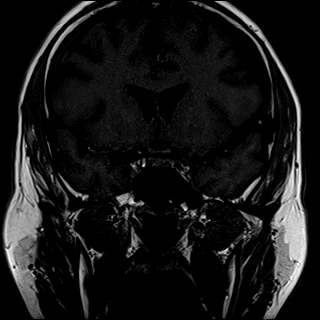
[im 9/32]
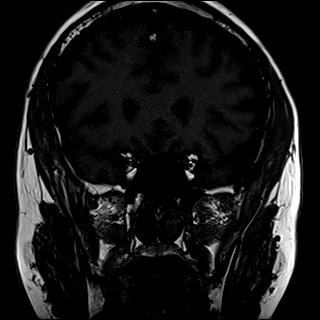
[im 14/32]
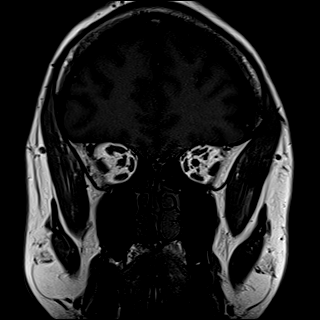
[im 18/32]
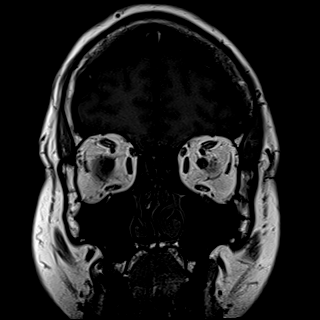
[im 23/32]
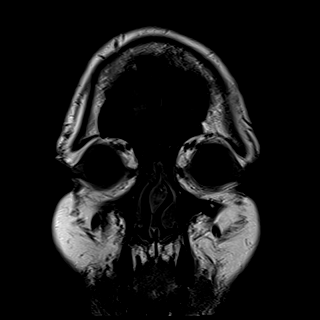
[im 27/32]
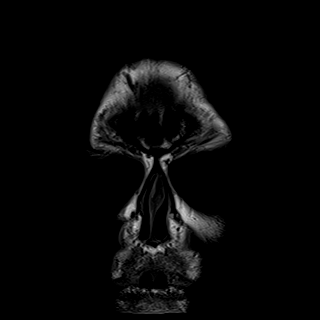
[im 32/32]
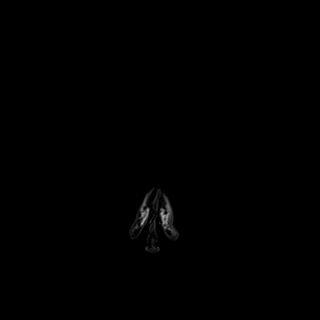

[Series 14: T1 fat-sat post-contrast · axial · 3.0mm · 0.56mm/px · z∈[-64,-9]mm · 5 of 20 slices shown (1 of 2)]
[im 1/20]
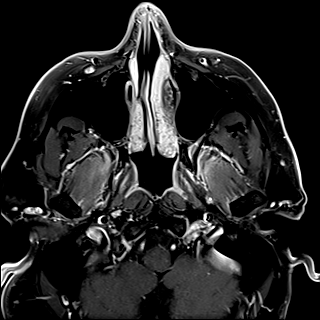
[im 5/20]
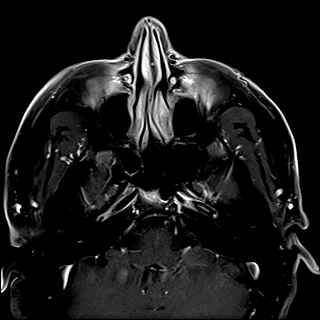
[im 10/20]
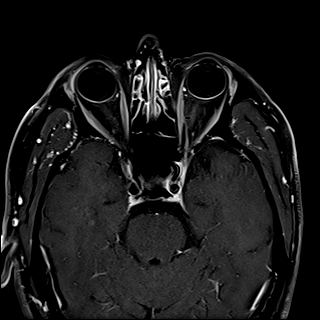
[im 15/20]
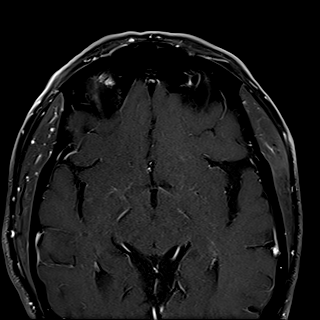
[im 20/20]
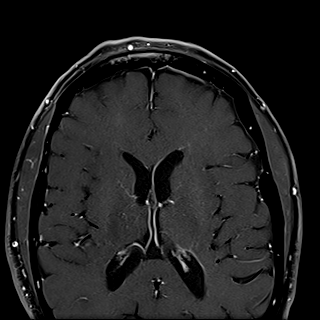

[Series 15: T1 fat-sat post-contrast · coronal · 3.0mm · 0.70mm/px · 1 of 32 slices shown (2 of 2)]
[im 1/32]
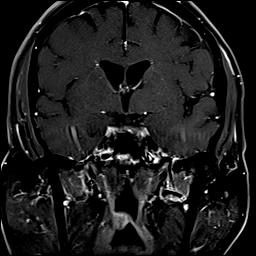

[41 of 48 positions shown; findings below may reference images not displayed]

FINDINGS: MRI HEAD FINDINGS

Brain: No restricted diffusion to suggest acute or subacute infarct.
No acute hemorrhage, mass, mass effect, or midline shift. No
hydrocephalus or extra-axial collection. No abnormal parenchymal or
meningeal scattered T2 hyperintense signal in the periventricular
white matter, likely the sequela of mild-to-moderate chronic small
vessel ischemic disease. No foci of hemosiderin deposition to
suggest remote hemorrhage.

Vascular: Normal flow voids.

Skull and upper cervical spine: Normal marrow signal.

Other: None.

MRI ORBITS FINDINGS

Orbits: No traumatic or inflammatory finding. Globes, optic nerves,
orbital fat, extraocular muscles, vascular structures, and lacrimal
glands are normal. No definite abnormal enhancement.

Visualized sinuses: Clear. No evidence of acute or chronic
sinusitis.

Soft tissues: Negative.
IMPRESSION: 1. No acute intracranial process.
2. Normal MRI of the orbits.
3. No evidence of acute or chronic sinusitis.

## 2023-12-14 IMAGING — MR MR HEAD WO/W CM
15 series · 48 of 48 positions shown · IV contrast (gadavist)
Comparison: No prior MRI, correlation is made with CT maxillofacial
12/24/2021

CLINICAL DATA: Sinus infection concern for stroke, double vision

EXAM:
MRI HEAD AND ORBITS WITHOUT AND WITH CONTRAST
TECHNIQUE: Multiplanar, multiecho pulse sequences of the brain and surrounding
structures were obtained without and with intravenous contrast.
Multiplanar, multiecho pulse sequences of the orbits and surrounding
structures were obtained including fat saturation techniques, before
and after intravenous contrast administration.
CONTRAST:  10mL GADAVIST GADOBUTROL 1 MMOL/ML IV SOLN

[Series 1: DWI · axial · 3.0mm · 1.36mm/px · z∈[-77,+47]mm · 6 of 96 slices shown (1 of 2)]
[im 1/96]
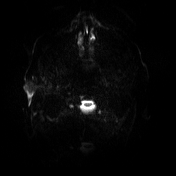
[im 20/96]
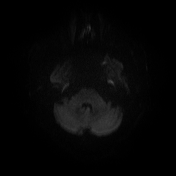
[im 39/96]
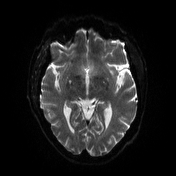
[im 58/96]
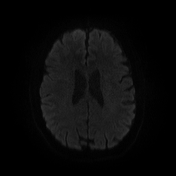
[im 77/96]
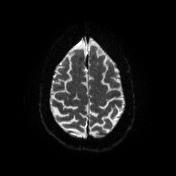
[im 96/96]
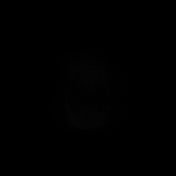

[Series 2: DWI · axial · 3.0mm · 1.36mm/px · z∈[-77,+47]mm · 3 of 48 slices shown (2 of 2)]
[im 1/48]
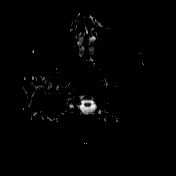
[im 24/48]
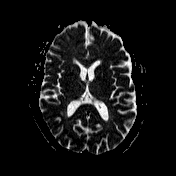
[im 48/48]
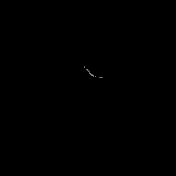

[Series 3: T1 · sagittal · 5.0mm · 0.75mm/px · 1 of 24 slices shown (1 of 4)]
[im 1/24]
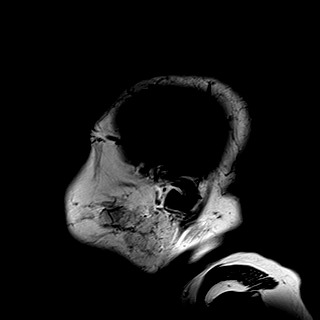

[Series 4: T2 · axial · 5.0mm · 0.62mm/px · 1 of 22 slices shown]
[im 1/22]
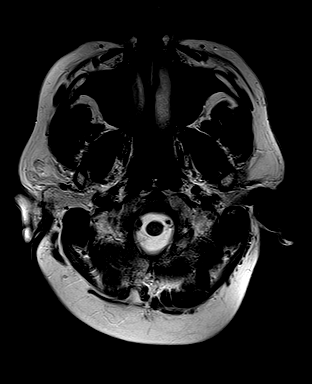

[Series 5: swi_images · axial · 3.0mm · 0.75mm/px · z∈[-85,+60]mm · 3 of 56 slices shown]
[im 1/56]
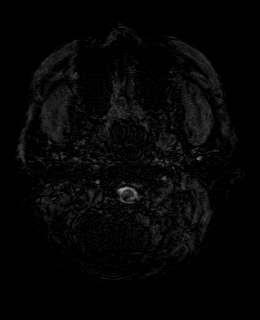
[im 28/56]
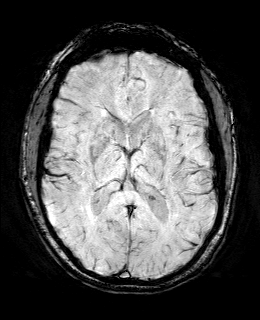
[im 56/56]
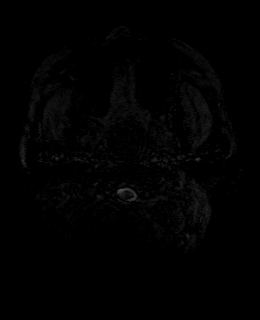

[Series 7: FLAIR · axial · 3.0mm · 0.75mm/px · z∈[-78,+52]mm · 3 of 50 slices shown]
[im 1/50]
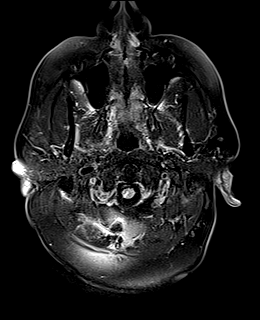
[im 25/50]
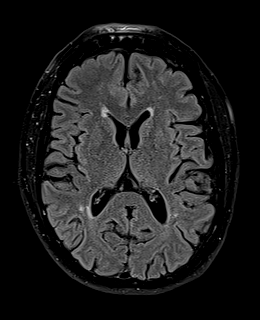
[im 50/50]
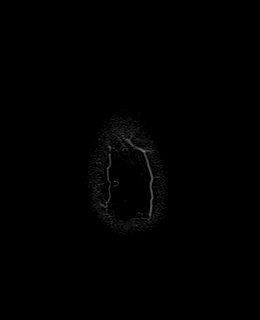

[Series 8: T1 · axial · 1.0mm · 0.94mm/px · z∈[-76,+51]mm · 8 of 144 slices shown (2 of 4)]
[im 1/144]
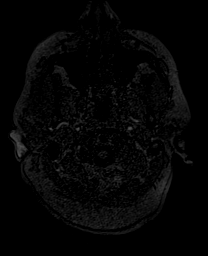
[im 21/144]
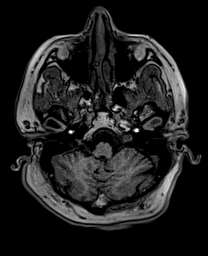
[im 41/144]
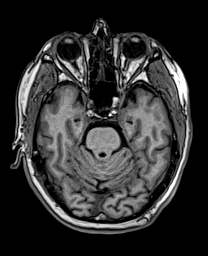
[im 62/144]
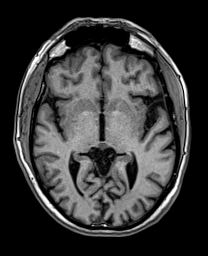
[im 82/144]
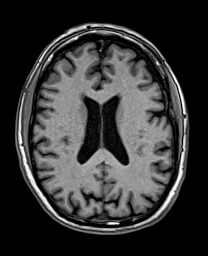
[im 103/144]
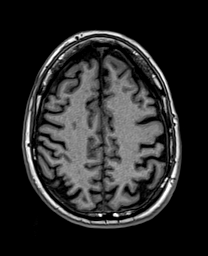
[im 123/144]
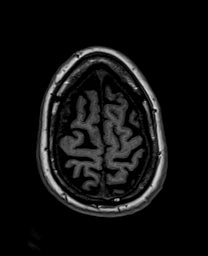
[im 144/144]
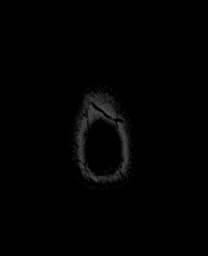

[Series 9: cor dwi_tracew · coronal · 5.0mm · 1.53mm/px · 3 of 60 slices shown]
[im 1/60]
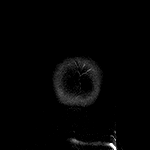
[im 30/60]
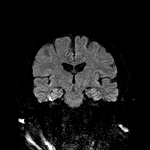
[im 60/60]
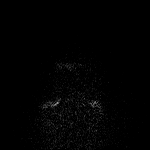

[Series 10: cor dwi_adc · coronal · 5.0mm · 1.53mm/px · 2 of 29 slices shown]
[im 1/29]
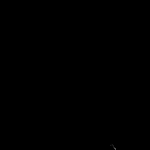
[im 29/29]
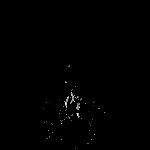

[Series 11: T2 post-contrast · coronal · 5.0mm · 0.57mm/px · 2 of 28 slices shown]
[im 1/28]
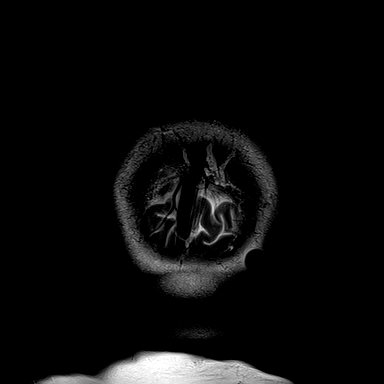
[im 28/28]
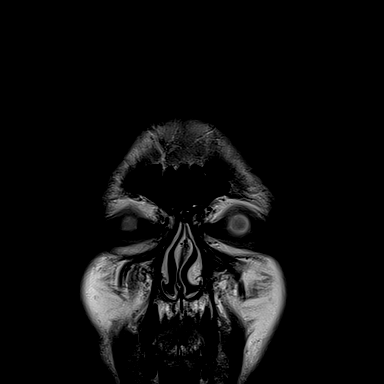

[Series 12: T1 post-contrast · axial · 1.0mm · 0.94mm/px · z∈[-76,+51]mm · 8 of 144 slices shown (1 of 3)]
[im 1/144]
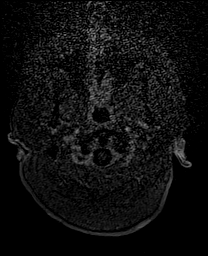
[im 21/144]
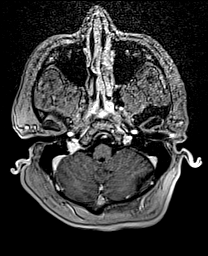
[im 41/144]
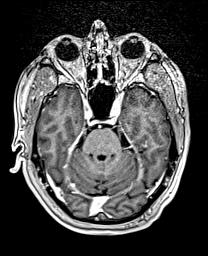
[im 62/144]
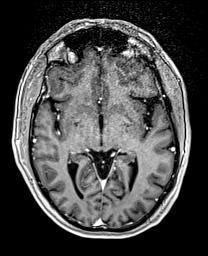
[im 82/144]
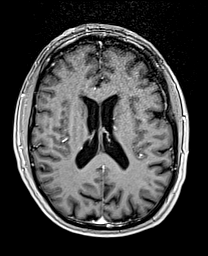
[im 103/144]
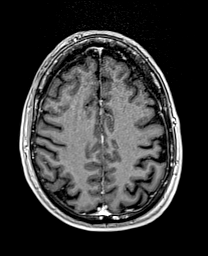
[im 123/144]
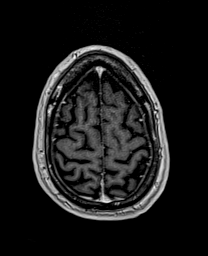
[im 144/144]
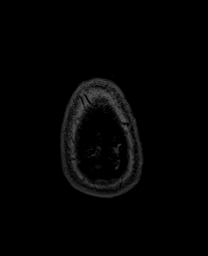

[Series 13: T1 · sagittal · 4.0mm · 0.94mm/px · 2 of 34 slices shown (3 of 4)]
[im 1/34]
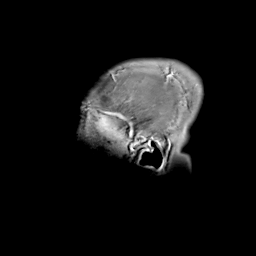
[im 34/34]
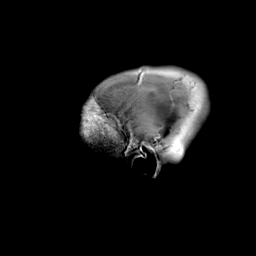

[Series 14: T1 · coronal · 4.0mm · 0.94mm/px · 3 of 44 slices shown (4 of 4)]
[im 1/44]
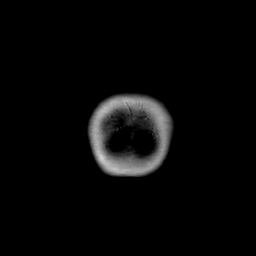
[im 22/44]
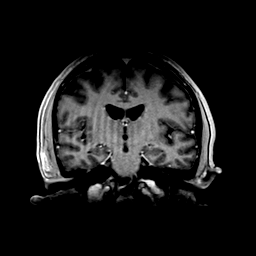
[im 44/44]
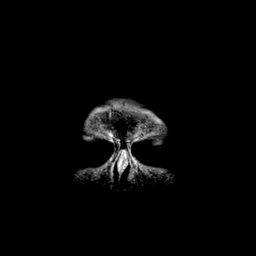

[Series 15: T1 post-contrast · coronal · 5.0mm · 0.43mm/px · 2 of 28 slices shown (2 of 3)]
[im 1/28]
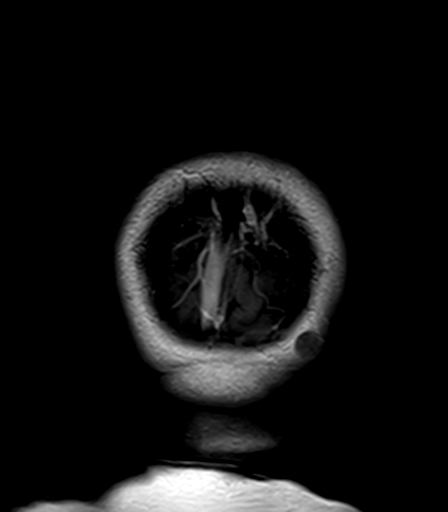
[im 28/28]
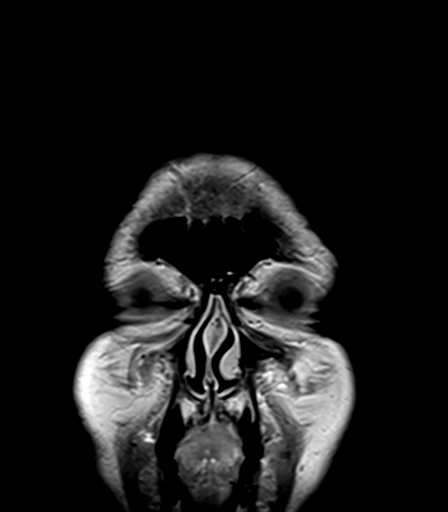

[Series 16: T1 post-contrast · sagittal · 5.0mm · 0.75mm/px · 1 of 24 slices shown (3 of 3)]
[im 1/24]
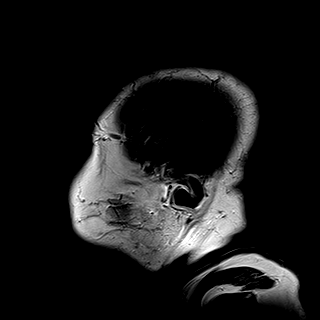

[48 of 48 positions shown; findings below may reference images not displayed]

FINDINGS: MRI HEAD FINDINGS

Brain: No restricted diffusion to suggest acute or subacute infarct.
No acute hemorrhage, mass, mass effect, or midline shift. No
hydrocephalus or extra-axial collection. No abnormal parenchymal or
meningeal scattered T2 hyperintense signal in the periventricular
white matter, likely the sequela of mild-to-moderate chronic small
vessel ischemic disease. No foci of hemosiderin deposition to
suggest remote hemorrhage.

Vascular: Normal flow voids.

Skull and upper cervical spine: Normal marrow signal.

Other: None.

MRI ORBITS FINDINGS

Orbits: No traumatic or inflammatory finding. Globes, optic nerves,
orbital fat, extraocular muscles, vascular structures, and lacrimal
glands are normal. No definite abnormal enhancement.

Visualized sinuses: Clear. No evidence of acute or chronic
sinusitis.

Soft tissues: Negative.
IMPRESSION: 1. No acute intracranial process.
2. Normal MRI of the orbits.
3. No evidence of acute or chronic sinusitis.

## 2023-12-20 ENCOUNTER — Ambulatory Visit (INDEPENDENT_AMBULATORY_CARE_PROVIDER_SITE_OTHER)

## 2023-12-20 DIAGNOSIS — J309 Allergic rhinitis, unspecified: Secondary | ICD-10-CM

## 2024-01-17 ENCOUNTER — Ambulatory Visit (INDEPENDENT_AMBULATORY_CARE_PROVIDER_SITE_OTHER)

## 2024-01-17 DIAGNOSIS — J309 Allergic rhinitis, unspecified: Secondary | ICD-10-CM | POA: Diagnosis not present

## 2024-02-14 ENCOUNTER — Ambulatory Visit (INDEPENDENT_AMBULATORY_CARE_PROVIDER_SITE_OTHER)

## 2024-02-14 DIAGNOSIS — J309 Allergic rhinitis, unspecified: Secondary | ICD-10-CM

## 2024-02-28 ENCOUNTER — Telehealth: Payer: Self-pay | Admitting: Allergy

## 2024-02-28 NOTE — Telephone Encounter (Signed)
 Phillip Hatfield has been referred to Dr. Delayne at [facility].  Phillip Hatfield has been scheduled for 05/03/24 at 3:40 pm and he has been made aware of this appointment.

## 2024-03-12 NOTE — Progress Notes (Unsigned)
 Follow Up Note  RE: Phillip Hatfield MRN: [id number] DOB: July 11, 1966 Date of Office Visit: 03/13/2024  Referring provider: Tisovec, Richard W, MD Primary care provider: Tisovec, Richard W, MD  Chief Complaint: No chief complaint on file.  History of Present Illness: I had the pleasure of seeing Phillip Hatfield for a follow up visit at the [facility] on 03/13/2024. He is a 58 y.o. male, who is being followed for allergic rhinitis on AIT, GERD, voice change. His previous allergy  office visit was on 03/22/2023 with Dr. Luke. Today is a regular follow up visit.  Discussed the use of AI scribe software for clinical note transcription with the patient, who gave verbal consent to proceed.  History of Present Illness            ***  Assessment and Plan: [name] is a 58 y.o. male with: Seasonal allergic rhinitis due to pollen Past history - Declines nasal sprays due to epistaxis.  No prior ENT evaluation.  2023 CT sinus unremarkable. 2023 skin testing showed: Positive to trees and grass. Started AIT on 03/16/2022 (GT). Interim history -  symptoms improved with AIT. Some sneezing and rhinorrhea.  Continue environmental control measures as below. Use over the counter antihistamines such as Zyrtec (cetirizine), Claritin (loratadine), Allegra (fexofenadine), or Xyzal (levocetirizine) daily as needed. May take twice a day during allergy  flares. May switch antihistamines every few months. Nasal saline spray (i.e., Simply Saline) or nasal saline lavage (i.e., NeilMed) is recommended as needed. Continue allergy  injections - given today.  Make sure you take your allergy  medication on the days of your injections.    Gastroesophageal reflux disease Better with Nexium 20mg  BID. No EGD. Continue lifestyle and dietary modifications. May take Nexium 20mg  twice a day. Follow up with PCP and ask about GI referral for EGD.   Change in voice Requesting ENT referral. Discussed  this may be due to his GERD too. Dr. Delayne is a vocal cord specialist.   Elevated blood pressure reading Follow up with PCP regarding this.  Assessment and Plan              No follow-ups on file.  No orders of the defined types were placed in this encounter.  Lab Orders  No laboratory test(s) ordered today    Diagnostics: Spirometry:  Tracings reviewed. His effort: {Blank single:19197::Good reproducible efforts.,It was hard to get consistent efforts and there is a question as to whether this reflects a maximal maneuver.,Poor effort, data can not be interpreted.} FVC: ***L FEV1: ***L, ***% predicted FEV1/FVC ratio: ***% Interpretation: {Blank single:19197::Spirometry consistent with mild obstructive disease,Spirometry consistent with moderate obstructive disease,Spirometry consistent with severe obstructive disease,Spirometry consistent with possible restrictive disease,Spirometry consistent with mixed obstructive and restrictive disease,Spirometry uninterpretable due to technique,Spirometry consistent with normal pattern,No overt abnormalities noted given today's efforts}.  Please see scanned spirometry results for details.  Skin Testing: {Blank single:19197::Select foods,Environmental allergy  panel,Environmental allergy  panel and select foods,Food allergy  panel,None,Deferred due to recent antihistamines use}. *** Results discussed with patient/family.   Medication List:  Current Outpatient Medications  Medication Sig Dispense Refill  . aspirin EC 81 MG tablet 1 tablet once daily Oral    . EPINEPHrine  0.3 mg/0.3 mL IJ SOAJ injection Inject 0.3 mg into the muscle as needed for anaphylaxis. 1 each 2  . Esomeprazole Magnesium (NEXIUM 24HR) 20 MG TBEC Take 1 tab daily Oral    . ezetimibe (ZETIA) 10 MG tablet Take 10 mg by mouth daily.    SABRA  glucose blood (FREESTYLE LITE) test strip USE TO TEST SUGAR ONCE DAILY for 30    . hydrochlorothiazide  (HYDRODIURIL) 25 MG tablet Take 25 mg by mouth 2 (two) times daily.    . Insulin Pen Needle (B-D ULTRAFINE III SHORT PEN) 31G X 8 MM MISC use one needle daily wiith insulin E11.65 for 90 days    . Insulin Pen Needle (BD PEN NEEDLE NANO 2ND GEN) 32G X 4 MM MISC USE TO INJECT TOUJEO AS DIRECTED (DX: ICD10: E11.29) for 90    . JARDIANCE 25 MG TABS tablet Take 25 mg by mouth daily.    SABRA KOMBIGLYZE XR 2.01-999 MG TB24 Take 1 tablet by mouth 2 (two) times daily. (Patient not taking: Reported on 03/22/2023)  6  . LANTUS SOLOSTAR 100 UNIT/ML Solostar Pen Inject into the skin.    SABRA levocetirizine (XYZAL) 5 MG tablet Take 1 tablet by mouth daily.    SABRA lisinopril (ZESTRIL) 40 MG tablet 1 tablet Oral Once a day for 90 days    . meloxicam  (MOBIC ) 15 MG tablet Take 15 mg by mouth daily.    . mupirocin ointment (BACTROBAN) 2 % SMARTSIG:1 Application Topical 2-3 Times Daily    . simvastatin (ZOCOR) 40 MG tablet Take 40 mg by mouth at bedtime.     No current facility-administered medications for this visit.   Allergies: Allergies  Allergen Reactions  . Atenolol Anaphylaxis   I reviewed his past medical history, social history, family history, and environmental history and no significant changes have been reported from his previous visit.  Review of Systems  Constitutional:  Negative for appetite change, chills, fever and unexpected weight change.  HENT:  Positive for rhinorrhea, sneezing and voice change. Negative for postnasal drip.   Eyes:  Negative for itching.  Respiratory:  Negative for cough, chest tightness, shortness of breath and wheezing.   Cardiovascular:  Negative for chest pain.  Gastrointestinal:  Negative for abdominal pain.  Genitourinary:  Negative for difficulty urinating.  Skin:  Negative for rash.  Allergic/Immunologic: Positive for environmental allergies.  Neurological:  Negative for headaches.    Objective: There were no vitals taken for this visit. There is no height or  weight on file to calculate BMI. Physical Exam Vitals and nursing note reviewed.  Constitutional:      Appearance: Normal appearance. He is well-developed.  HENT:     Head: Normocephalic and atraumatic.     Right Ear: Tympanic membrane and external ear normal.     Left Ear: Tympanic membrane and external ear normal.     Nose: Nose normal.     Mouth/Throat:     Mouth: Mucous membranes are moist.     Pharynx: Oropharynx is clear.  Eyes:     Conjunctiva/sclera: Conjunctivae normal.  Cardiovascular:     Rate and Rhythm: Normal rate and regular rhythm.     Heart sounds: Normal heart sounds. No murmur heard.    No friction rub. No gallop.  Pulmonary:     Effort: Pulmonary effort is normal.     Breath sounds: Normal breath sounds. No wheezing, rhonchi or rales.  Musculoskeletal:     Cervical back: Neck supple.  Skin:    General: Skin is warm.     Findings: No rash.  Neurological:     Mental Status: He is alert and oriented to person, place, and time.  Psychiatric:        Behavior: Behavior normal.   Previous notes and tests were reviewed. The  plan was reviewed with the patient/family, and all questions/concerned were addressed.  It was my pleasure to see Phillip Hatfield today and participate in his care. Please feel free to contact me with any questions or concerns.  Sincerely,  Orlan Cramp, DO Allergy  & Immunology  [facility]  [place] office: [phone] [facility] office: [phone]

## 2024-03-13 ENCOUNTER — Ambulatory Visit (INDEPENDENT_AMBULATORY_CARE_PROVIDER_SITE_OTHER): Admitting: Allergy

## 2024-03-13 ENCOUNTER — Encounter: Payer: Self-pay | Admitting: Allergy

## 2024-03-13 ENCOUNTER — Other Ambulatory Visit: Payer: Self-pay

## 2024-03-13 VITALS — BP 130/78 | HR 60 | Temp 97.9°F | Resp 18 | Ht 67.27 in | Wt 216.6 lb

## 2024-03-13 DIAGNOSIS — K219 Gastro-esophageal reflux disease without esophagitis: Secondary | ICD-10-CM | POA: Diagnosis not present

## 2024-03-13 DIAGNOSIS — J301 Allergic rhinitis due to pollen: Secondary | ICD-10-CM | POA: Diagnosis not present

## 2024-03-13 DIAGNOSIS — I129 Hypertensive chronic kidney disease with stage 1 through stage 4 chronic kidney disease, or unspecified chronic kidney disease: Secondary | ICD-10-CM | POA: Insufficient documentation

## 2024-03-13 DIAGNOSIS — N182 Chronic kidney disease, stage 2 (mild): Secondary | ICD-10-CM | POA: Insufficient documentation

## 2024-03-13 NOTE — Patient Instructions (Addendum)
 Environmental allergies 2023 skin testing positive to trees and grass.  Continue environmental control measures.  May use the following medications as needed:  Use over the counter antihistamines such as Zyrtec (cetirizine), Claritin (loratadine), Allegra (fexofenadine), or Xyzal (levocetirizine) daily as needed. May take twice a day during allergy  flares. May switch antihistamines every few months. Nasal saline spray (i.e., Simply Saline) or nasal saline lavage (i.e., NeilMed) is recommended as needed. Stop allergy  injections - signed form today. If you notice worsening symptoms let us  know.   Heartburn Continue lifestyle and dietary modifications. May take Nexium 20mg  twice a day. Follow up with PCP and ask about GI referral for EGD.   Vocal cord Keep appointment with Dr. Delayne.   Follow up as needed only.   Reducing Pollen Exposure Pollen seasons: trees (spring), grass (summer) and ragweed/weeds (fall). Keep windows closed in your home and car to lower pollen exposure.  Install air conditioning in the bedroom and throughout the house if possible.  Avoid going out in dry windy days - especially early morning. Pollen counts are highest between 5 - 10 AM and on dry, hot and windy days.  Save outside activities for late afternoon or after a heavy rain, when pollen levels are lower.  Avoid mowing of grass if you have grass pollen allergy . Be aware that pollen can also be transported indoors on people and pets.  Dry your clothes in an automatic dryer rather than hanging them outside where they might collect pollen.  Rinse hair and eyes before bedtime.
